# Patient Record
Sex: Male | Born: 1995 | Hispanic: Yes | Marital: Single | Smoking: Current every day smoker
Health system: Southern US, Community
[De-identification: ages and names within clinical notes are randomized; demographics above are authoritative.]

## PROBLEM LIST (undated history)

## (undated) DIAGNOSIS — Z789 Other specified health status: Secondary | ICD-10-CM

---
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
## 2015-03-24 ENCOUNTER — Encounter: Payer: Self-pay | Admitting: Urgent Care

## 2015-03-24 ENCOUNTER — Emergency Department
Admission: EM | Admit: 2015-03-24 | Discharge: 2015-03-25 | Disposition: A | Discharge status: Other Acute Inpatient Hospital | Payer: Self-pay | Attending: Emergency Medicine | Admitting: Emergency Medicine

## 2015-03-24 DIAGNOSIS — Y9389 Activity, other specified: Secondary | ICD-10-CM | POA: Insufficient documentation

## 2015-03-24 DIAGNOSIS — X58XXXA Exposure to other specified factors, initial encounter: Secondary | ICD-10-CM | POA: Insufficient documentation

## 2015-03-24 DIAGNOSIS — T424X2A Poisoning by benzodiazepines, intentional self-harm, initial encounter: Secondary | ICD-10-CM | POA: Insufficient documentation

## 2015-03-24 DIAGNOSIS — Y9289 Other specified places as the place of occurrence of the external cause: Secondary | ICD-10-CM | POA: Insufficient documentation

## 2015-03-24 DIAGNOSIS — Z720 Tobacco use: Secondary | ICD-10-CM | POA: Insufficient documentation

## 2015-03-24 DIAGNOSIS — F322 Major depressive disorder, single episode, severe without psychotic features: Secondary | ICD-10-CM | POA: Insufficient documentation

## 2015-03-24 DIAGNOSIS — T6592XA Toxic effect of unspecified substance, intentional self-harm, initial encounter: Secondary | ICD-10-CM

## 2015-03-24 DIAGNOSIS — Y998 Other external cause status: Secondary | ICD-10-CM | POA: Insufficient documentation

## 2015-03-24 LAB — CBC
HCT: 51.3 % (ref 40.0–52.0)
Hemoglobin: 17.4 g/dL (ref 13.0–18.0)
MCH: 29.7 pg (ref 26.0–34.0)
MCHC: 33.9 g/dL (ref 32.0–36.0)
MCV: 87.7 fL (ref 80.0–100.0)
PLATELETS: 214 10*3/uL (ref 150–440)
RBC: 5.85 MIL/uL (ref 4.40–5.90)
RDW: 12.8 % (ref 11.5–14.5)
WBC: 7.3 10*3/uL (ref 3.8–10.6)

## 2015-03-24 NOTE — ED Notes (Signed)
BEHAVIORAL HEALTH ROUNDING Patient sleeping: No. Patient alert and oriented: yes Behavior appropriate: Yes.  ; If no, describe:  Nutrition and fluids offered: Yes  Toileting and hygiene offered: Yes  Sitter present: no Law enforcement present: Yes, [facility] 

## 2015-03-24 NOTE — ED Notes (Signed)

## 2015-03-24 NOTE — ED Notes (Addendum)
Patient presents reporting that he wants to kill himself. Patient reports that he took 3812 Xanax yesterday and 3 today - "I did it because of what my momma told" - would not elaborate. Patient reporting that he is altered - "I dont even know where the hell I am at right now." After triage completed patient stating, "My momma called me yesterday. I havent talked to her in 4 years. She said I am sorry I even had you and I wish that you were dead."

## 2015-03-25 ENCOUNTER — Inpatient Hospital Stay
Admit: 2015-03-25 | Discharge: 2015-03-29 | DRG: 885 | Disposition: A | Discharge status: Home / Self Care | Payer: No Typology Code available for payment source | Attending: Psychiatry | Admitting: Psychiatry

## 2015-03-25 ENCOUNTER — Encounter: Payer: Self-pay | Admitting: Psychiatry

## 2015-03-25 DIAGNOSIS — Z818 Family history of other mental and behavioral disorders: Secondary | ICD-10-CM | POA: Diagnosis not present

## 2015-03-25 DIAGNOSIS — F410 Panic disorder [episodic paroxysmal anxiety] without agoraphobia: Secondary | ICD-10-CM | POA: Diagnosis present

## 2015-03-25 DIAGNOSIS — F909 Attention-deficit hyperactivity disorder, unspecified type: Secondary | ICD-10-CM | POA: Diagnosis present

## 2015-03-25 DIAGNOSIS — F322 Major depressive disorder, single episode, severe without psychotic features: Secondary | ICD-10-CM | POA: Diagnosis present

## 2015-03-25 DIAGNOSIS — T1491 Suicide attempt: Secondary | ICD-10-CM | POA: Diagnosis present

## 2015-03-25 DIAGNOSIS — F17210 Nicotine dependence, cigarettes, uncomplicated: Secondary | ICD-10-CM | POA: Diagnosis present

## 2015-03-25 DIAGNOSIS — F3130 Bipolar disorder, current episode depressed, mild or moderate severity, unspecified: Secondary | ICD-10-CM | POA: Diagnosis present

## 2015-03-25 DIAGNOSIS — F1310 Sedative, hypnotic or anxiolytic abuse, uncomplicated: Secondary | ICD-10-CM | POA: Diagnosis present

## 2015-03-25 DIAGNOSIS — T424X2A Poisoning by benzodiazepines, intentional self-harm, initial encounter: Secondary | ICD-10-CM | POA: Diagnosis present

## 2015-03-25 DIAGNOSIS — F1320 Sedative, hypnotic or anxiolytic dependence, uncomplicated: Secondary | ICD-10-CM | POA: Diagnosis present

## 2015-03-25 DIAGNOSIS — F319 Bipolar disorder, unspecified: Principal | ICD-10-CM | POA: Diagnosis present

## 2015-03-25 DIAGNOSIS — G4700 Insomnia, unspecified: Secondary | ICD-10-CM | POA: Diagnosis present

## 2015-03-25 DIAGNOSIS — T50902A Poisoning by unspecified drugs, medicaments and biological substances, intentional self-harm, initial encounter: Secondary | ICD-10-CM | POA: Diagnosis present

## 2015-03-25 HISTORY — DX: Other specified health status: Z78.9

## 2015-03-25 LAB — COMPREHENSIVE METABOLIC PANEL
ALT: 19 U/L (ref 17–63)
AST: 24 U/L (ref 15–41)
Albumin: 4.7 g/dL (ref 3.5–5.0)
Alkaline Phosphatase: 100 U/L (ref 38–126)
Anion gap: 9 (ref 5–15)
BUN: 15 mg/dL (ref 6–20)
CO2: 28 mmol/L (ref 22–32)
Calcium: 9.5 mg/dL (ref 8.9–10.3)
Chloride: 103 mmol/L (ref 101–111)
Creatinine, Ser: 1.05 mg/dL (ref 0.61–1.24)
Glucose, Bld: 84 mg/dL (ref 65–99)
POTASSIUM: 3.9 mmol/L (ref 3.5–5.1)
SODIUM: 140 mmol/L (ref 135–145)
Total Bilirubin: 0.6 mg/dL (ref 0.3–1.2)
Total Protein: 7.8 g/dL (ref 6.5–8.1)

## 2015-03-25 LAB — URINE DRUG SCREEN, QUALITATIVE (ARMC ONLY)
Amphetamines, Ur Screen: NOT DETECTED
Barbiturates, Ur Screen: NOT DETECTED
Benzodiazepine, Ur Scrn: POSITIVE — AB
CANNABINOID 50 NG, UR ~~LOC~~: POSITIVE — AB
Cocaine Metabolite,Ur ~~LOC~~: NOT DETECTED
MDMA (Ecstasy)Ur Screen: NOT DETECTED
Methadone Scn, Ur: NOT DETECTED
Opiate, Ur Screen: NOT DETECTED
PHENCYCLIDINE (PCP) UR S: NOT DETECTED
Tricyclic, Ur Screen: NOT DETECTED

## 2015-03-25 LAB — ACETAMINOPHEN LEVEL

## 2015-03-25 LAB — SALICYLATE LEVEL

## 2015-03-25 LAB — ETHANOL: Alcohol, Ethyl (B): <5 mg/dL (ref <5)

## 2015-03-25 MED ORDER — ALUM & MAG HYDROXIDE-SIMETH 200-200-20 MG/5ML PO SUSP: 30.0000 mL | ORAL | Status: DC | PRN

## 2015-03-25 MED ORDER — CHLORDIAZEPOXIDE HCL 25 MG PO CAPS
25.0000 mg | ORAL_CAPSULE | Freq: Four times a day (QID) | ORAL | Status: DC
  Administered 2015-03-25 – 2015-03-26 (×5): 25 mg via ORAL
  Filled 2015-03-25 (×6): qty 1

## 2015-03-25 MED ORDER — NICOTINE 10 MG IN INHA
1.0000 | RESPIRATORY_TRACT | Status: DC | PRN
  Administered 2015-03-25: 1 via RESPIRATORY_TRACT

## 2015-03-25 MED ORDER — CARBAMAZEPINE 200 MG PO TABS
200.0000 mg | ORAL_TABLET | Freq: Two times a day (BID) | ORAL | Status: DC
  Administered 2015-03-25 – 2015-03-29 (×8): 200 mg via ORAL
  Filled 2015-03-25 (×8): qty 1

## 2015-03-25 MED ORDER — ACETAMINOPHEN 325 MG PO TABS: 650.0000 mg | ORAL_TABLET | Freq: Four times a day (QID) | ORAL | Status: DC | PRN

## 2015-03-25 MED ORDER — SERTRALINE HCL 50 MG PO TABS
50.0000 mg | ORAL_TABLET | Freq: Every day | ORAL | Status: DC
  Administered 2015-03-25 – 2015-03-27 (×3): 50 mg via ORAL
  Filled 2015-03-25 (×3): qty 1

## 2015-03-25 MED ORDER — MAGNESIUM HYDROXIDE 400 MG/5ML PO SUSP: 30.0000 mL | Freq: Every day | ORAL | Status: DC | PRN

## 2015-03-25 MED ORDER — NICOTINE 10 MG IN INHA
RESPIRATORY_TRACT | Status: AC
  Administered 2015-03-25: 1 via RESPIRATORY_TRACT
  Filled 2015-03-25: qty 36

## 2015-03-25 MED ORDER — TRAZODONE HCL 100 MG PO TABS
100.0000 mg | ORAL_TABLET | Freq: Every day | ORAL | Status: DC
  Administered 2015-03-25 – 2015-03-26 (×2): 100 mg via ORAL
  Filled 2015-03-25 (×2): qty 1

## 2015-03-25 NOTE — ED Notes (Signed)
BEHAVIORAL HEALTH ROUNDING Patient sleeping: Yes.   Patient alert and oriented: not applicable Behavior appropriate: Yes.  ; If no, describe:  Nutrition and fluids offered: Yes  Toileting and hygiene offered: Yes  Sitter present: yes Law enforcement present: Yes [facility] 

## 2015-03-25 NOTE — ED Notes (Signed)

## 2015-03-25 NOTE — H&P (Signed)
Psychiatric Admission Assessment Adult  Patient Identification: Ryan Mccormick MRN:  [id number] Date of Evaluation:  03/25/2015 Chief Complaint:  Major Depression Principal Diagnosis: <principal problem not specified> Diagnosis:   Patient Active Problem List   Diagnosis Date Noted  . 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 Mr. Ryan Mccormick is a 19 year old male with history of depression, anxiety, mood instability, and substance use.  Chief complaint. "My mother called me."  History of present illness. Mr. Ryan Mccormick was diagnosed with ADHD in his childhood but never treated. In his teens he developed severe anxiety with frequent panic attacks. He has never seen a psychiatrist and has never had any prescribed medications but started using Xanax is off the street. He believes that in order to function at all he needs to take Xanax all the time. In addition he's been using other drugs and on Sunday went on a drug binge with Xanax, Klonopin, acid, Molly's, and alcohol. On Tuesday he received a phone call from his mother with whom he has not talked for 6 or so months. She told him that she didn't like him and she wished he were never born. He got upset to goal the Xanax as he had on him at 12 or 13 of unknown strength and was drinking. His friends and he is and his cousin were present and they brought him to the emergency room. He does not remember much from that point on. He woke up in the emergency room. He denies feeling suicidal now and does not believe that this was a suicide attempt and that he acted on an impulse. He reports many symptoms of depression with extremely poor sleep, decreased appetite reportedly has not eaten in 3 weeks, social isolation, crying spells, anhedonia, feeling of guilt and hopelessness worthlessness, extreme mood swings, and heightened anxiety, and passing suicidal  thoughts. He overdosed several times in the past but has never come to the hospital. He reports periods of severe insomnia hyperactivity out of character behavior with break psychotic symptoms hearing voices that are not there and seeing shadows. He does not drink alcohol. He admits that he is using Xanax on a regular basis. Infrequently when partly uses other substance including cocaine.  Past psychiatric history. He was diagnosed with ADHD and while in school but never treated. No hospital admissions no confirmed suicide attempts, no substance abuse treatments.   family psychiatric history. Multiple family members on his father's side with depression and bipolar including grandmother and grandfather. On his mother's side there is a grandmother with bipolar disorder. Apparently his mother is not well either.  Social history. He dropped out of high school just now. This was mostly because he was using Xanax and did not want to get in trouble. He will go to community college for his GED's. He did not do well in school. He currently lives with his aunt. He works as a Psychologist, sport and exercise at [employer]. He has a girlfriend and a group of friends   Total Time spent with patient: 1 hour  Past Medical History: No past medical history on file. No past surgical history on file. Family History: No family history on file. Social History:  History  Alcohol Use No     History  Drug Use  . Yes  . Special: Marijuana    History   Social History  . Marital Status: Single    Spouse Name: N/A  .  Number of Children: N/A  . Years of Education: N/A   Social History Main Topics  . Smoking status: Current Every Day Smoker    Types: Cigarettes  . Smokeless tobacco: Not on file  . Alcohol Use: No  . Drug Use: Yes    Special: Marijuana  . Sexual Activity: Yes   Other Topics Concern  . Not on file   Social History Narrative   Additional Social History:                           Musculoskeletal: Strength & Muscle Tone: within normal limits Gait & Station: normal Patient leans: N/A  Psychiatric Specialty Exam: Physical Exam  Nursing note and vitals reviewed.   Review of Systems  All other systems reviewed and are negative.   There were no vitals taken for this visit.There is no weight on file to calculate BMI.  See SRA.                                                  Sleep:      Risk to Self:   Risk to Others:   Prior Inpatient Therapy:   Prior Outpatient Therapy:    Alcohol Screening:    Allergies:  No Known Allergies Lab Results:  Results for orders placed or performed during the hospital encounter of 03/24/15 (from the past 48 hour(s))  CBC     Status: None   Collection Time: 03/24/15 11:31 PM  Result Value Ref Range   WBC 7.3 3.8 - 10.6 K/uL   RBC 5.85 4.40 - 5.90 MIL/uL   Hemoglobin 17.4 13.0 - 18.0 g/dL   HCT 51.3 40.0 - 52.0 %   MCV 87.7 80.0 - 100.0 fL   MCH 29.7 26.0 - 34.0 pg   MCHC 33.9 32.0 - 36.0 g/dL   RDW 12.8 11.5 - 14.5 %   Platelets 214 150 - 440 K/uL  Comprehensive metabolic panel     Status: None   Collection Time: 03/24/15 11:31 PM  Result Value Ref Range   Sodium 140 135 - 145 mmol/L   Potassium 3.9 3.5 - 5.1 mmol/L   Chloride 103 101 - 111 mmol/L   CO2 28 22 - 32 mmol/L   Glucose, Bld 84 65 - 99 mg/dL   BUN 15 6 - 20 mg/dL   Creatinine, Ser 1.05 0.61 - 1.24 mg/dL   Calcium 9.5 8.9 - 10.3 mg/dL   Total Protein 7.8 6.5 - 8.1 g/dL   Albumin 4.7 3.5 - 5.0 g/dL   AST 24 15 - 41 U/L   ALT 19 17 - 63 U/L   Alkaline Phosphatase 100 38 - 126 U/L   Total Bilirubin 0.6 0.3 - 1.2 mg/dL   GFR calc non Af Amer >60 >60 mL/min   GFR calc Af Amer >60 >60 mL/min    Comment: (NOTE) The eGFR has been calculated using the CKD EPI equation. This calculation has not been validated in all clinical situations. eGFR's persistently <60 mL/min signify possible Chronic Kidney Disease.    Anion gap 9 5 -  15  Ethanol (ETOH)     Status: None   Collection Time: 03/24/15 11:31 PM  Result Value Ref Range   Alcohol, Ethyl (B) <5 <5 mg/dL    Comment:  LOWEST DETECTABLE LIMIT FOR SERUM ALCOHOL IS 5 mg/dL FOR MEDICAL PURPOSES ONLY   Acetaminophen level     Status: Abnormal   Collection Time: 03/24/15 11:31 PM  Result Value Ref Range   Acetaminophen (Tylenol), Serum <10 (L) 10 - 30 ug/mL    Comment:        THERAPEUTIC CONCENTRATIONS VARY SIGNIFICANTLY. A RANGE OF 10-30 ug/mL MAY BE AN EFFECTIVE CONCENTRATION FOR MANY PATIENTS. HOWEVER, SOME ARE BEST TREATED AT CONCENTRATIONS OUTSIDE THIS RANGE. ACETAMINOPHEN CONCENTRATIONS >150 ug/mL AT 4 HOURS AFTER INGESTION AND >50 ug/mL AT 12 HOURS AFTER INGESTION ARE OFTEN ASSOCIATED WITH TOXIC REACTIONS.   Salicylate level     Status: None   Collection Time: 03/24/15 11:31 PM  Result Value Ref Range   Salicylate Lvl <2.5 2.8 - 30.0 mg/dL  Urine Drug Screen, Qualitative ([facility] only)     Status: Abnormal   Collection Time: 03/24/15 11:32 PM  Result Value Ref Range   Tricyclic, Ur Screen NONE DETECTED NONE DETECTED   Amphetamines, Ur Screen NONE DETECTED NONE DETECTED   MDMA (Ecstasy)Ur Screen NONE DETECTED NONE DETECTED   Cocaine Metabolite,Ur [place] NONE DETECTED NONE DETECTED   Opiate, Ur Screen NONE DETECTED NONE DETECTED   Phencyclidine (PCP) Ur S NONE DETECTED NONE DETECTED   Cannabinoid 50 Ng, Ur  POSITIVE (A) NONE DETECTED   Barbiturates, Ur Screen NONE DETECTED NONE DETECTED   Benzodiazepine, Ur Scrn POSITIVE (A) NONE DETECTED   Methadone Scn, Ur NONE DETECTED NONE DETECTED    Comment: (NOTE) 427  Tricyclics, urine               Cutoff 1000 ng/mL 200  Amphetamines, urine             Cutoff 1000 ng/mL 300  MDMA (Ecstasy), urine           Cutoff 500 ng/mL 400  Cocaine Metabolite, urine       Cutoff 300 ng/mL 500  Opiate, urine                   Cutoff 300 ng/mL 600  Phencyclidine (PCP), urine      Cutoff 25 ng/mL 700   Cannabinoid, urine              Cutoff 50 ng/mL 800  Barbiturates, urine             Cutoff 200 ng/mL 900  Benzodiazepine, urine           Cutoff 200 ng/mL 1000 Methadone, urine                Cutoff 300 ng/mL 1100 1200 The urine drug screen provides only a preliminary, unconfirmed 1300 analytical test result and should not be used for non-medical 1400 purposes. Clinical consideration and professional judgment should 1500 be applied to any positive drug screen result due to possible 1600 interfering substances. A more specific alternate chemical method 1700 must be used in order to obtain a confirmed analytical result.  1800 Gas chromato graphy / mass spectrometry (GC/MS) is the preferred 1900 confirmatory method.    Current Medications: Current Facility-Administered Medications  Medication Dose Route Frequency Provider Last Rate Last Dose  . acetaminophen (TYLENOL) tablet 650 mg  650 mg Oral Q6H PRN Jolanta B Pucilowska, MD      . alum & mag hydroxide-simeth (MAALOX/MYLANTA) 200-200-20 MG/5ML suspension 30 mL  30 mL Oral Q4H PRN Jolanta B Pucilowska, MD      . carbamazepine (TEGRETOL) tablet 200 mg  200 mg Oral BID Jolanta B Pucilowska, MD      . chlordiazePOXIDE (LIBRIUM) capsule 25 mg  25 mg Oral QID Jolanta B Pucilowska, MD      . magnesium hydroxide (MILK OF MAGNESIA) suspension 30 mL  30 mL Oral Daily PRN Jolanta B Pucilowska, MD      . sertraline (ZOLOFT) tablet 50 mg  50 mg Oral Daily Jolanta B Pucilowska, MD      . traZODone (DESYREL) tablet 100 mg  100 mg Oral QHS Jolanta B Pucilowska, MD       PTA Medications: No prescriptions prior to admission    Previous Psychotropic Medications: No   Substance Abuse History in the last 12 months:  Yes.      Consequences of Substance Abuse: Negative  Results for orders placed or performed during the hospital encounter of 03/24/15 (from the past 72 hour(s))  CBC     Status: None   Collection Time: 03/24/15 11:31 PM  Result  Value Ref Range   WBC 7.3 3.8 - 10.6 K/uL   RBC 5.85 4.40 - 5.90 MIL/uL   Hemoglobin 17.4 13.0 - 18.0 g/dL   HCT 51.3 40.0 - 52.0 %   MCV 87.7 80.0 - 100.0 fL   MCH 29.7 26.0 - 34.0 pg   MCHC 33.9 32.0 - 36.0 g/dL   RDW 12.8 11.5 - 14.5 %   Platelets 214 150 - 440 K/uL  Comprehensive metabolic panel     Status: None   Collection Time: 03/24/15 11:31 PM  Result Value Ref Range   Sodium 140 135 - 145 mmol/L   Potassium 3.9 3.5 - 5.1 mmol/L   Chloride 103 101 - 111 mmol/L   CO2 28 22 - 32 mmol/L   Glucose, Bld 84 65 - 99 mg/dL   BUN 15 6 - 20 mg/dL   Creatinine, Ser 1.05 0.61 - 1.24 mg/dL   Calcium 9.5 8.9 - 10.3 mg/dL   Total Protein 7.8 6.5 - 8.1 g/dL   Albumin 4.7 3.5 - 5.0 g/dL   AST 24 15 - 41 U/L   ALT 19 17 - 63 U/L   Alkaline Phosphatase 100 38 - 126 U/L   Total Bilirubin 0.6 0.3 - 1.2 mg/dL   GFR calc non Af Amer >60 >60 mL/min   GFR calc Af Amer >60 >60 mL/min    Comment: (NOTE) The eGFR has been calculated using the CKD EPI equation. This calculation has not been validated in all clinical situations. eGFR's persistently <60 mL/min signify possible Chronic Kidney Disease.    Anion gap 9 5 - 15  Ethanol (ETOH)     Status: None   Collection Time: 03/24/15 11:31 PM  Result Value Ref Range   Alcohol, Ethyl (B) <5 <5 mg/dL    Comment:        LOWEST DETECTABLE LIMIT FOR SERUM ALCOHOL IS 5 mg/dL FOR MEDICAL PURPOSES ONLY   Acetaminophen level     Status: Abnormal   Collection Time: 03/24/15 11:31 PM  Result Value Ref Range   Acetaminophen (Tylenol), Serum <10 (L) 10 - 30 ug/mL    Comment:        THERAPEUTIC CONCENTRATIONS VARY SIGNIFICANTLY. A RANGE OF 10-30 ug/mL MAY BE AN EFFECTIVE CONCENTRATION FOR MANY PATIENTS. HOWEVER, SOME ARE BEST TREATED AT CONCENTRATIONS OUTSIDE THIS RANGE. ACETAMINOPHEN CONCENTRATIONS >150 ug/mL AT 4 HOURS AFTER INGESTION AND >50 ug/mL AT 12 HOURS AFTER INGESTION ARE OFTEN ASSOCIATED WITH TOXIC REACTIONS.   Salicylate level  Status: None   Collection Time: 03/24/15 11:31 PM  Result Value Ref Range   Salicylate Lvl <6.2 2.8 - 30.0 mg/dL  Urine Drug Screen, Qualitative ([facility] only)     Status: Abnormal   Collection Time: 03/24/15 11:32 PM  Result Value Ref Range   Tricyclic, Ur Screen NONE DETECTED NONE DETECTED   Amphetamines, Ur Screen NONE DETECTED NONE DETECTED   MDMA (Ecstasy)Ur Screen NONE DETECTED NONE DETECTED   Cocaine Metabolite,Ur [place] NONE DETECTED NONE DETECTED   Opiate, Ur Screen NONE DETECTED NONE DETECTED   Phencyclidine (PCP) Ur S NONE DETECTED NONE DETECTED   Cannabinoid 50 Ng, Ur  POSITIVE (A) NONE DETECTED   Barbiturates, Ur Screen NONE DETECTED NONE DETECTED   Benzodiazepine, Ur Scrn POSITIVE (A) NONE DETECTED   Methadone Scn, Ur NONE DETECTED NONE DETECTED    Comment: (NOTE) 947  Tricyclics, urine               Cutoff 1000 ng/mL 200  Amphetamines, urine             Cutoff 1000 ng/mL 300  MDMA (Ecstasy), urine           Cutoff 500 ng/mL 400  Cocaine Metabolite, urine       Cutoff 300 ng/mL 500  Opiate, urine                   Cutoff 300 ng/mL 600  Phencyclidine (PCP), urine      Cutoff 25 ng/mL 700  Cannabinoid, urine              Cutoff 50 ng/mL 800  Barbiturates, urine             Cutoff 200 ng/mL 900  Benzodiazepine, urine           Cutoff 200 ng/mL 1000 Methadone, urine                Cutoff 300 ng/mL 1100 1200 The urine drug screen provides only a preliminary, unconfirmed 1300 analytical test result and should not be used for non-medical 1400 purposes. Clinical consideration and professional judgment should 1500 be applied to any positive drug screen result due to possible 1600 interfering substances. A more specific alternate chemical method 1700 must be used in order to obtain a confirmed analytical result.  1800 Gas chromato graphy / mass spectrometry (GC/MS) is the preferred 1900 confirmatory method.     Observation Level/Precautions:  15 minute checks  Laboratory:   CBC Chemistry Profile UDS UA  Psychotherapy:    Medications:    Consultations:    Discharge Concerns:    Estimated LOS:  Other:     Psychological Evaluations: No   Treatment Plan Summary: Daily contact with patient to assess and evaluate symptoms and progress in treatment and Medication management  Medical Decision Making:  New problem, with additional work up planned, Review of Psycho-Social Stressors (1), Review or order clinical lab tests (1), Review of Medication Regimen & Side Effects (2) and Review of New Medication or Change in Dosage (2)   Mr. Ryan Mccormick is a 19 year old male with history of depression, anxiety, mood instability, and substance use admitted after a suicide attempt by Xanax overdose.  1. Suicidal ideation. The patient is able to contract for safety.  2. Mood. We will start Tegretol 200 mg twice daily for mood stabilization and Zoloft 50 mg for depression.  3. Anxiety. The patient has been taking Xanax off the street to treat his anxiety. He takes them daily.  Will start Librium taper.  4. Insomnia. We'll start trazodone.  5. Smoking. Nicotine products are available.  6. Disposition. He will be discharged to home with his family. Follow-up with [facility].   I certify that inpatient services furnished can reasonably be expected to improve the patient's condition.   Jolanta Pucilowska 7/7/20162:17 PM

## 2015-03-25 NOTE — ED Notes (Signed)
Pt report given to Joanne CharsBeth B RN. Pt transferred to ED BHU accompanied by reporting RN without incident.

## 2015-03-25 NOTE — ED Notes (Signed)
BEHAVIORAL HEALTH ROUNDING Patient sleeping: Yes.   Patient alert and oriented: not applicable Behavior appropriate: Yes.    Nutrition and fluids offered: No Toileting and hygiene offered: No Sitter present: q15 minute observations and security camera monitoring Law enforcement present: Yes [employer] 

## 2015-03-25 NOTE — ED Notes (Signed)
BEHAVIORAL HEALTH ROUNDING Patient sleeping: Yes.   Patient alert and oriented: yes Behavior appropriate: Yes.  ; If no, describe:  Nutrition and fluids offered: Yes  Toileting and hygiene offered: Yes  Sitter present: yes Law enforcement present: Yes [facility]  

## 2015-03-25 NOTE — [facility] Group Notes (Signed)
[facility] Group Notes:  (Nursing/MHT/Case Management/Adjunct)  Date:  03/25/2015  Time:  9:22 PM  Type of Therapy:  Group Therapy  Participation Level:  Active  Participation Quality:  Appropriate  Affect:  Appropriate  Cognitive:  Appropriate  Insight:  Good  Engagement in Group:  Engaged  Modes of Intervention:  n/a  Summary of Progress/Problems:  Veva Holesshley Imani Natavia Sublette 03/25/2015, 9:22 PM

## 2015-03-25 NOTE — [facility] Suicide Risk Assessment (Addendum)
[facility] Admission Suicide Risk Assessment   Nursing information obtained from:    Demographic factors:    Current Mental Status:    Loss Factors:    Historical Factors:    Risk Reduction Factors:    Total Time spent with patient: 1 hour Principal Problem: <principal problem not specified> Diagnosis:   Patient Active Problem List   Diagnosis Date Noted  . 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  World Science writer [facility]). Score between 0-7:  no or low risk or alcohol related problems. Score between 8-15:  moderate risk of alcohol related problems. Score between 16-19:  high risk of alcohol related problems. Score 20 or above:  warrants further diagnostic evaluation for alcohol dependence and treatment.   CLINICAL FACTORS:   Severe Anxiety and/or Agitation Bipolar Disorder:   Depressive phase Alcohol/Substance Abuse/Dependencies   Musculoskeletal: Strength & Muscle Tone: within normal limits Gait & Station: normal Patient leans: N/A  Psychiatric Specialty Exam:   I reviewed physical exam performed in the emergency room and calm With its findings. Physical Exam  Nursing note and vitals reviewed.   Review of Systems  All other systems reviewed and are negative.   There were no vitals taken for this visit.There is no weight on file to calculate BMI.  General Appearance: Casual  Eye Contact::  Good  Speech:  Clear and Coherent  Volume:  Normal  Mood:  Anxious  Affect:  Appropriate  Thought Process:  Goal Directed  Orientation:  Full (Time, Place, and Person)  Thought Content:  WDL  Suicidal Thoughts:  Yes.  without intent/plan  Homicidal Thoughts:  No  Memory:  Immediate;   Fair Recent;   Fair Remote;   Fair  Judgement:  Impaired  Insight:  Shallow  Psychomotor  Activity:  Normal  Concentration:  Fair  Recall:  [employer] of Knowledge:Fair  Language: Fair  Akathisia:  No  Handed:  Right  AIMS (if indicated):     Assets:  Architect Housing Physical Health  Sleep:     Cognition: WNL  ADL's:  Intact     COGNITIVE FEATURES THAT CONTRIBUTE TO RISK:  None    SUICIDE RISK:   Moderate:  Frequent suicidal ideation with limited intensity, and duration, some specificity in terms of plans, no associated intent, good self-control, limited dysphoria/symptomatology, some risk factors present, and identifiable protective factors, including available and accessible social support.  PLAN OF CARE: Hospital admission, medication management, substance abuse counseling, discharge planning.  Medical Decision Making:  New problem, with additional work up planned, Review of Psycho-Social Stressors (1), Review or order clinical lab tests (1), Review of Medication Regimen & Side Effects (2) and Review of New Medication or Change in Dosage (2)   Ryan Mccormick is a 19 year old male with history of depression, anxiety, mood instability, and substance use admitted after a suicide attempt by Xanax overdose.  1. Suicidal ideation. The patient is able to contract for safety.  2. Mood. We will start Tegretol 200 mg twice daily for mood stabilization and Zoloft 50 mg for depression.  3. Anxiety. The patient has been taking Xanax off the street to treat his anxiety. He takes them daily. Will start Librium taper.  4. Insomnia. We'll start trazodone.  5. Smoking. Nicotine products are available.  6. Disposition. He will be  discharged to home with his family. Follow-up with [facility].  I certify that inpatient services furnished can reasonably be expected to improve the patient's condition.   Ryan Mccormick 03/25/2015, 2:12 PM 2

## 2015-03-25 NOTE — Tx Team (Signed)
Initial Interdisciplinary Treatment Plan   PATIENT STRESSORS: Educational concerns Marital or family conflict Medication change or noncompliance   PATIENT STRENGTHS: Capable of independent living Manufacturing systems engineerCommunication skills Motivation for treatment/growth   PROBLEM LIST: Problem List/Patient Goals Date to be addressed Date deferred Reason deferred Estimated date of resolution  Major Depression  03/25/15     Anxiety  03/25/15                                                 DISCHARGE CRITERIA:  Adequate post-discharge living arrangements Improved stabilization in mood, thinking, and/or behavior Motivation to continue treatment in a less acute level of care  PRELIMINARY DISCHARGE PLAN: Attend aftercare/continuing care group Return to previous living arrangement Return to previous work or school arrangements  PATIENT/FAMIILY INVOLVEMENT: This treatment plan has been presented to and reviewed with the patient, Ryan Mccormick, and/or family member, .  The patient and family have been given the opportunity to ask questions and make suggestions.  Ignacia FellingJennifer A Jacy Brocker 03/25/2015, 3:56 PM

## 2015-03-25 NOTE — BH Assessment (Signed)
Assessment Note  Ryan Mccormick is an 19 y.o. male, who presents to the ED via a family member(cousin) for c/o having taken an overdose of xanax. Client stating, "I got a call from my mom telling me, "I wish I never had you; I wish you were dead." "I went overboard and took some pills; I took (13) xanax pills and drank (3) shots of vodka; and I blacked out; I couldn't breathe; I had some weed and cocaine today; you see, me and my mom don't get along; once I turned (13); she stopped caring for me; I thought about not being bothered any more; and I took the pills." BAC: <5; UDS: pending.  Axis I: Generalized Anxiety Disorder, Major Depression, Recurrent severe and Substance Abuse Axis II: Deferred Axis III: History reviewed. No pertinent past medical history. Axis IV: other psychosocial or environmental problems, problems with access to health care services and problems with primary support group Axis V: 1-10 persistent dangerousness to self and others present  Past Medical History: History reviewed. No pertinent past medical history.  History reviewed. No pertinent past surgical history.  Family History: No family history on file.  Social History:  reports that he has been smoking Cigarettes.  He does not have any smokeless tobacco history on file. He reports that he uses illicit drugs (Marijuana). He reports that he does not drink alcohol.  Additional Social History:     CIWA: CIWA-Ar BP: 136/77 mmHg Pulse Rate: 71 COWS:    Allergies: No Known Allergies  Home Medications:  (Not in a hospital admission)  OB/GYN Status:  No LMP for male patient.  General Assessment Data [facility] Assessment: In system Is this a Tele or Face-to-Face Assessment?: Face-to-Face Is this an Initial Assessment or a Re-assessment for this encounter?: Re-Assessment Marital status: Single Maiden name: none Is patient pregnant?: No Pregnancy Status: No Living Arrangements:  Parent ("I live with my stepdad.") Can pt return to current living arrangement?: Yes Admission Status: Voluntary Is patient capable of signing voluntary admission?: Yes Referral Source: Self/Family/Friend Insurance type: none  Medical Screening Exam [facility] Walk-in ONLY) Medical Exam completed: No  Crisis Care Plan Living Arrangements: Parent ("I live with my stepdad.") Name of Psychiatrist: none Name of Therapist: none  Education Status Is patient currently in school?: No Current Grade: none Highest grade of school patient has completed: 11th Name of school: n/a Contact person: stepdad  Risk to self with the past 6 months Suicidal Ideation: Yes-Currently Present Has patient been a risk to self within the past 6 months prior to admission? : Yes Suicidal Intent: Yes-Currently Present Has patient had any suicidal intent within the past 6 months prior to admission? : Yes Is patient at risk for suicide?: Yes Suicidal Plan?: Yes-Currently Present Has patient had any suicidal plan within the past 6 months prior to admission? : No Specify Current Suicidal Plan: "I took a bunch of xanax." Access to Means: Yes Specify Access to Suicidal Means: pills What has been your use of drugs/alcohol within the last 12 months?: alcohol; cocaine Previous Attempts/Gestures: Yes How many times?: 1 Other Self Harm Risks: feeling helpless and hopeless Triggers for Past Attempts: Family contact Intentional Self Injurious Behavior: None Family Suicide History: No Recent stressful life event(s): Conflict (Comment) ("My mother called me and told me, "I wish I never had you.") Persecutory voices/beliefs?: No Depression: Yes Depression Symptoms: Tearfulness, Despondent, Feeling worthless/self pity Substance abuse history and/or treatment for substance abuse?: Yes Suicide prevention  information given to non-admitted patients: Yes  Risk to Others within the past 6 months Homicidal Ideation: No Does  patient have any lifetime risk of violence toward others beyond the six months prior to admission? : No Thoughts of Harm to Others: No Current Homicidal Intent: No Current Homicidal Plan: No Access to Homicidal Means: No Identified Victim: none History of harm to others?: No Assessment of Violence: On admission Violent Behavior Description: none Does patient have access to weapons?: No Criminal Charges Pending?: No Does patient have a court date: No Is patient on probation?: No  Psychosis Hallucinations: None noted Delusions: None noted  Mental Status Report Appearance/Hygiene: In scrubs Eye Contact: Fair Motor Activity: Unremarkable Speech: Slow, Slurred Level of Consciousness: Drowsy Mood: Helpless, Sad Affect: Depressed, Sad Anxiety Level: Minimal Thought Processes: Circumstantial Judgement: Impaired Orientation: Person, Place, Situation Obsessive Compulsive Thoughts/Behaviors: Minimal  Cognitive Functioning Concentration: Decreased Memory: Recent Impaired, Remote Intact IQ: Average Insight: Poor Impulse Control: Poor Appetite: Fair Weight Loss: 0 Weight Gain: 0 Sleep: Decreased Total Hours of Sleep: 4 Vegetative Symptoms: None  ADLScreening [facility] Assessment Services) Patient's cognitive ability adequate to safely complete daily activities?: Yes Patient able to express need for assistance with ADLs?: Yes Independently performs ADLs?: Yes (appropriate for developmental age)  Prior Inpatient Therapy Prior Inpatient Therapy: No Prior Therapy Dates: denies  Prior Outpatient Therapy Prior Outpatient Therapy: No Does patient have an ACCT team?: No Does patient have Intensive In-House Services?  : No Does patient have [facility] services? : No Does patient have P4CC services?: No  ADL Screening (condition at time of admission) Patient's cognitive ability adequate to safely complete daily activities?: Yes Patient able to express need for assistance with ADLs?:  Yes Independently performs ADLs?: Yes (appropriate for developmental age)       Abuse/Neglect Assessment (Assessment to be complete while patient is alone) Physical Abuse: Denies Verbal Abuse: Denies Sexual Abuse: Denies Exploitation of patient/patient's resources: Denies Self-Neglect: Denies Values / Beliefs Cultural Requests During Hospitalization: None Spiritual Requests During Hospitalization: None Consults Spiritual Care Consult Needed: No Social Work Consult Needed: No      Additional Information 1:1 In Past 12 Months?: No CIRT Risk: No Elopement Risk: No Does patient have medical clearance?: No (continuing to monitor)  Child/Adolescent Assessment Running Away Risk: Denies Bed-Wetting: Denies Destruction of Property: Denies Cruelty to Animals: Denies Stealing: Denies Rebellious/Defies Authority: Denies Satanic Involvement: Denies Archivist: Denies Problems at [employer]: Denies Gang Involvement: Denies  Disposition:  Disposition Initial Assessment Completed for this Encounter: Yes Disposition of Patient: Referred to (psych MD to see) Patient referred to: Other (Comment)  On Site Evaluation by:   Reviewed with Physician:    Dwan Bolt 03/25/2015 12:18 AM

## 2015-03-25 NOTE — Plan of Care (Signed)
Problem: Ineffective individual coping Goal: LTG: Patient will report a decrease in negative feelings Outcome: Progressing Patient denies SI/HI at this time. Encouraged to attend therapy groups and learn coping skills to manage stressors.

## 2015-03-25 NOTE — ED Provider Notes (Signed)
Patient remains medically stable with normal vital signs. He is admitted to the behavioral medicine unit for further psychiatric evaluation and stabilization.  [name], MD 03/25/15 1106

## 2015-03-25 NOTE — ED Provider Notes (Signed)
[facility] Emergency Department Provider Note ____________________________________________  Time seen: Approximately 12:03 AM  I have reviewed the triage vital signs and the nursing notes.   HISTORY  Chief Complaint Suicide Attempt  HPI Ryan Mccormick is a 19 y.o. male who states that he had an argument with his mother today and she told him that "she wishes he would have never been born and she hated him "therefore patient decided that there was no reason for him to live on earth anymore and he took 69-15 Xanax and Klonopin along with smoking some marijuana doing some cocaine. This all occurred about 5 or 6 in the evening and patient states that he initially was very dizzy and sleepy but now he feels much better. Patient's cousin brought him here to be evaluated. Patient denies any other significant medical issues.  History reviewed. No pertinent past medical history.  There are no active problems to display for this patient.   History reviewed. No pertinent past surgical history.  No current outpatient prescriptions on file.  Allergies Review of patient's allergies indicates no known allergies.  No family history on file.  Social History History  Substance Use Topics  . Smoking status: Current Every Day Smoker    Types: Cigarettes  . Smokeless tobacco: Not on file  . Alcohol Use: No    Review of Systems Constitutional: No fever/chills Eyes: No visual changes. ENT: No sore throat. Cardiovascular: Denies chest pain. Respiratory: Denies shortness of breath. Gastrointestinal: No abdominal pain.  No nausea, no vomiting.  No diarrhea.  No constipation. Genitourinary: Negative for dysuria. Musculoskeletal: Negative for back pain. Skin: Negative for rash. Neurological: Negative for headaches, focal weakness or numbness. Psychiatric:Severe depression, suicide attempt by overdose 10-point ROS otherwise  negative.  ____________________________________________   PHYSICAL EXAM:  VITAL SIGNS: ED Triage Vitals  Enc Vitals Group     BP 03/24/15 2321 136/77 mmHg     Pulse Rate 03/24/15 2321 71     Resp 03/24/15 2321 14     Temp 03/24/15 2321 98.9 F (37.2 C)     Temp Source 03/24/15 2321 Oral     SpO2 03/24/15 2321 98 %     Weight 03/24/15 2321 220 lb (99.791 kg)     Height 03/24/15 2321  (1.727 m)     Head Cir --      Peak Flow --      Pain Score 03/24/15 2322 8     Pain Loc --      Pain Edu? --      Excl. in GC? --     Constitutional: Alert and oriented. Well appearing and in no acute distress. Eyes: Conjunctivae are normal. PERRL. EOMI. Head: Atraumatic. Nose: No congestion/rhinnorhea. Mouth/Throat: Mucous membranes are moist.  Oropharynx non-erythematous. Neck: No stridor.   Cardiovascular: Normal rate, regular rhythm. Grossly normal heart sounds.  Good peripheral circulation. Respiratory: Normal respiratory effort.  No retractions. Lungs CTAB. Gastrointestinal: Soft and nontender. No distention. No abdominal bruits. No CVA tenderness. Musculoskeletal: No lower extremity tenderness nor edema.  No joint effusions. Neurologic:  Normal speech and language. No gross focal neurologic deficits are appreciated. Speech is normal. No gait instability. Skin:  Skin is warm, dry and intact. No rash noted. Psychiatric: Mood and affect are very depressed. Speech and behavior are normal.  ____________________________________________   LABS (all labs ordered are listed, but only abnormal results are displayed)  Labs Reviewed  ACETAMINOPHEN LEVEL - Abnormal; Notable for the following:  Acetaminophen (Tylenol), Serum <10 (*)    All other components within normal limits  CBC  COMPREHENSIVE METABOLIC PANEL  ETHANOL  SALICYLATE LEVEL  URINE DRUG SCREEN, QUALITATIVE ([facility] ONLY)    ____________________________________________  EKG  None ____________________________________________  RADIOLOGY  None ____________________________________________   PROCEDURES  Procedure(s) performed: None  Critical Care performed: No  ____________________________________________   INITIAL IMPRESSION / ASSESSMENT AND PLAN / ED COURSE  Pertinent labs & imaging results that were available during my care of the patient were reviewed by me and considered in my medical decision making (see chart for details).  ----------------------------------------- 12:29 AM on 03/25/2015 -----------------------------------------  All patient's labs are normal and it has been at least 6 hours since patient took the overdose. Patient is alert and oriented 3 with no focal deficits at this time. Patient is medically cleared to go to the behavior health unit. ____________________________________________   FINAL CLINICAL IMPRESSION(S) / ED DIAGNOSES  Final diagnoses:  Severe depression  Suicide attempt by substance overdose, initial encounter      Leona CarryLinda M Ambriana Selway, MD 03/25/15 0030

## 2015-03-25 NOTE — [facility] Counselor (Signed)
[facility] by Dr. Jennet MaduroPucilowska. Attending Physician will be Dr. Ardyth HarpsHernandez.  Pt. [facility] 311[facility] Jerry CarasWendy S.,.  Intake Paper Work has been signed and placed on pt. chart. ER staff Misty Stanley(Lisa, ER Sect., Dr. Scotty CourtStafford ER MD & Floyce StakesMary N, RN) have been made aware of the admission.

## 2015-03-25 NOTE — ED Notes (Signed)
[facility]

## 2015-03-25 NOTE — [facility] Group Notes (Signed)
[facility] Group Notes:  (Nursing/MHT/Case Management/Adjunct)  Date:  03/25/2015  Time:  1:43 PM  Type of Therapy:  Group Therapy  Participation Level:  Active  Participation Quality:  Appropriate, Attentive and Supportive  Affect:  Appropriate  Cognitive:  Alert, Appropriate and Oriented  Insight:  Appropriate  Engagement in Group:  Engaged  Modes of Intervention:  Discussion  Summary of Progress/Problems:  Ryan Mccormick Ryan Mccormick Ryan Mccormick 03/25/2015, 1:43 PM

## 2015-03-25 NOTE — Progress Notes (Signed)
Patient alert and oriented x 4 and is calm and cooperative behavior with admission interview and assessment. Denies SI/HI at this time. Skin check performed by staff with no wounds or contraband found. Belonging check performed with cigarettes found and placed in locker. Patient admitted following argument with Mother and states his girlfriend is a positive support. [facility]. Poor insight into alcohol and Xanax use. Poor coping skills. Encouraged to attend therapy groups and to follow MD recommendations for plan of care. Safety maintained.

## 2015-03-25 NOTE — ED Notes (Signed)
BEHAVIORAL HEALTH ROUNDING Patient sleeping: No. Patient alert and oriented: yes Behavior appropriate: Yes.   Nutrition and fluids offered: Yes  Toileting and hygiene offered: Yes  Sitter present: q15 min observations and security camera monitoring Law enforcement present: Yes [employer]  ENVIRONMENTAL ASSESSMENT Potentially harmful objects out of patient reach: Yes.   Personal belongings secured: Yes.   Patient dressed in hospital provided attire only: Yes.   Plastic bags out of patient reach: Yes.   Patient care equipment removed: Yes.   Equipment and supplies removed: Yes.   Potentially toxic materials out of patient reach: Yes.   Sharps container removed or out of patient reach: Yes.    Pt brought into ED BHU via sally port by RN Brandy C., and wanded with metal detector for safety by [employer] officer. Patient oriented to unit/care area: Pt informed of unit policies and procedures.  Informed that, for their safety, care areas are designed for safety and monitored by security cameras at all times; and visiting hours explained to patient. Patient verbalizes understanding, and verbal contract for safety obtained.Pt shown to their room.  

## 2015-03-26 MED ORDER — CHLORDIAZEPOXIDE HCL 10 MG PO CAPS
10.0000 mg | ORAL_CAPSULE | Freq: Four times a day (QID) | ORAL | Status: AC
  Administered 2015-03-26 – 2015-03-28 (×6): 10 mg via ORAL
  Filled 2015-03-26 (×7): qty 1

## 2015-03-26 NOTE — [facility] Group Notes (Signed)
[facility] Group Notes:  (Nursing/MHT/Case Management/Adjunct)  Date:  03/26/2015  Time:  11:30 AM  Type of Therapy:  Movement Therapy  Participation Level:  Active  Participation Quality:  Appropriate, Attentive and Supportive  Affect:  Appropriate  Cognitive:  Alert, Appropriate and Oriented  Insight:  Appropriate  Engagement in Group:  Engaged  Modes of Intervention:  Activity  Summary of Progress/Problems:  Ryan Mccormick Ryan Mccormick 03/26/2015, 11:30 AM

## 2015-03-26 NOTE — Progress Notes (Signed)
D) Patient pleasant and cooperative upon my assessment. Patient did complete Self Inventory Assessment and rates depression as 8/10, patient rates hopeless feelings as 6/10.  Patient denies SI/HI, denies A/V hallucinations.  Patient's affect is appropriate and mood is sad/depressed.  Reports that his appetite is good.   A) Patient offered support and encouragement, patient encouraged to discuss feelings/concerns with staff. Patient verbalized understanding. Patient monitored Q15 minutes for safety. Patient met with MD  to discuss today's goals and plan of care.  R) Patient visible in milieu, he has  attended groups today.  Patient come to dining room for meals and snacks. Patient appropriate with staff and peers.   Patient taking medications as ordered. Will continue to monitor.

## 2015-03-26 NOTE — Plan of Care (Signed)
Problem: Diagnosis: Increased Risk For Suicide Attempt Goal: LTG-Patient Family Informed of Increased Suicide Risk LTG (by discharge) Patient's family or significant other informed of patient's increased risk for suicide and they will be able to verbalize ways they can help the patient stay safe after discharge.  Outcome: Progressing Patient denies SI/HI, 15 minutes checks maintained.

## 2015-03-26 NOTE — [facility] Group Notes (Signed)
[facility] LCSW Group Therapy  03/26/2015 3:14 PM  Type of Therapy:  Group Therapy  Participation Level:  Minimal  Participation Quality:  Drowsy and Inattentive  Affect:  Flat  Cognitive:  Disorganized  Insight:  Limited  Engagement in Therapy:  Limited  Modes of Intervention:  Discussion, Education, Socialization and Support  Summary of Progress/Problems:Feelings around Relapse. Group members discussed the meaning of relapse and shared personal stories of relapse, how it affected them and others, and how they perceived themselves during this time. Group members were encouraged to identify triggers, warning signs and coping skills used when facing the possibility of relapse. Social supports were discussed and explored in detail.Ryan BosworthCarlos states he feels "normal" when he is doing drugs. He reports using many drugs to feel "happy." During group, he had difficulties staying on topic. However, he stated that he is going to a substance abuse program because he wants to be a better father.   [employer] EnergyCandace L Colton Tassin MSW, LCSWA  03/26/2015, 3:14 PM

## 2015-03-26 NOTE — Progress Notes (Signed)
[facility] MD Progress Note  03/26/2015 4:57 PM Ryan Mccormick  MRN:  [id number]  Subjective:  Ryan Mccormick is a 19 year old male with history of depression, mood instability and polysubstance dependence admitted after a suicide attempt by Xanax overdose. The patient still feels depressed today and suicidal. He participates in groups and seemingly is gaining some insight into his problems. He spent this morning with Sherrian Divers, [place] liaison, and talking about her substance use. For months he has been using daily and is "high" all the time. He is interested in [facility] IOP participation. Sleep and appetite are good. There are no somatic complaints.  Principal Problem: Bipolar I disorder, most recent episode depressed Diagnosis:   Patient Active Problem List   Diagnosis Date Noted  . Bipolar I disorder, most recent episode depressed [F31.30] 03/25/2015  . 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 Medical history non-contributory    History reviewed. No pertinent past surgical history. Family History: History reviewed. No pertinent family history. Social History:  History  Alcohol Use No     History  Drug Use  . Yes  . Special: Marijuana    History   Social History  . Marital Status: Single    Spouse Name: N/A  . Number of Children: N/A  . Years of Education: N/A   Social History Main Topics  . Smoking status: Current Every Day Smoker    Types: Cigarettes  . Smokeless tobacco: Not on file  . Alcohol Use: No  . Drug Use: Yes    Special: Marijuana  . Sexual Activity: Yes   Other Topics Concern  . None   Social History Narrative   Additional History:    Sleep: Good  Appetite:  Good   Assessment:   Musculoskeletal: Strength & Muscle Tone: within normal limits Gait & Station: normal Patient leans:  N/A   Psychiatric Specialty Exam: Physical Exam  Nursing note and vitals reviewed.   Review of Systems  All other systems reviewed and are negative.   Blood pressure 119/65, pulse 73, temperature 98.2 F (36.8 C), temperature source Oral, resp. rate 20, height 5' 8"  (1.727 m), weight 77.565 kg (171 lb).Body mass index is 26.01 kg/(m^2).  General Appearance: Casual  Eye Contact::  Good  Speech:  Normal Rate  Volume:  Normal  Mood:  Depressed  Affect:  Depressed  Thought Process:  Goal Directed  Orientation:  Full (Time, Place, and Person)  Thought Content:  WDL  Suicidal Thoughts:  Yes.  with intent/plan  Homicidal Thoughts:  No  Memory:  Immediate;   Fair Recent;   Fair Remote;   Fair  Judgement:  Fair  Insight:  Fair  Psychomotor Activity:  Normal  Concentration:  Fair  Recall:  [employer] [place]  Language: Fair  Akathisia:  No  Handed:  Right  AIMS (if indicated):     Assets:  Communication Skills Desire for Improvement Financial Resources/Insurance Housing Physical Health  ADL's:  Intact  Cognition: WNL  Sleep:  Number of Hours: 7     Current Medications: Current Facility-Administered Medications  Medication Dose Route Frequency Provider Last Rate Last Dose  . acetaminophen (TYLENOL) tablet 650 mg  650 mg Oral Q6H PRN Atley Scarboro B Chakira Jachim, MD      . alum & mag hydroxide-simeth (MAALOX/MYLANTA) 200-200-20 MG/5ML suspension 30 mL  30 mL  Oral Q4H PRN Clovis Fredrickson, MD      . carbamazepine (TEGRETOL) tablet 200 mg  200 mg Oral BID Seleena Reimers B Bryna Razavi, MD   200 mg at 03/26/15 0800  . chlordiazePOXIDE (LIBRIUM) capsule 25 mg  25 mg Oral QID Cantrell Martus B Shayma Pfefferle, MD   25 mg at 03/26/15 1500  . magnesium hydroxide (MILK OF MAGNESIA) suspension 30 mL  30 mL Oral Daily PRN Rondle Lohse B Oluwademilade Mckiver, MD      . sertraline (ZOLOFT) tablet 50 mg  50 mg Oral Daily Bentlie Withem B Riti Rollyson, MD   50 mg at 03/26/15 1010  . traZODone (DESYREL) tablet 100 mg  100 mg  Oral QHS Clovis Fredrickson, MD   100 mg at 03/25/15 2200    Lab Results:  Results for orders placed or performed during the hospital encounter of 03/24/15 (from the past 48 hour(s))  CBC     Status: None   Collection Time: 03/24/15 11:31 PM  Result Value Ref Range   WBC 7.3 3.8 - 10.6 K/uL   RBC 5.85 4.40 - 5.90 MIL/uL   Hemoglobin 17.4 13.0 - 18.0 g/dL   HCT 51.3 40.0 - 52.0 %   MCV 87.7 80.0 - 100.0 fL   MCH 29.7 26.0 - 34.0 pg   MCHC 33.9 32.0 - 36.0 g/dL   RDW 12.8 11.5 - 14.5 %   Platelets 214 150 - 440 K/uL  Comprehensive metabolic panel     Status: None   Collection Time: 03/24/15 11:31 PM  Result Value Ref Range   Sodium 140 135 - 145 mmol/L   Potassium 3.9 3.5 - 5.1 mmol/L   Chloride 103 101 - 111 mmol/L   CO2 28 22 - 32 mmol/L   Glucose, Bld 84 65 - 99 mg/dL   BUN 15 6 - 20 mg/dL   Creatinine, Ser 1.05 0.61 - 1.24 mg/dL   Calcium 9.5 8.9 - 10.3 mg/dL   Total Protein 7.8 6.5 - 8.1 g/dL   Albumin 4.7 3.5 - 5.0 g/dL   AST 24 15 - 41 U/L   ALT 19 17 - 63 U/L   Alkaline Phosphatase 100 38 - 126 U/L   Total Bilirubin 0.6 0.3 - 1.2 mg/dL   GFR calc non Af Amer >60 >60 mL/min   GFR calc Af Amer >60 >60 mL/min    Comment: (NOTE) The eGFR has been calculated using the CKD EPI equation. This calculation has not been validated in all clinical situations. eGFR's persistently <60 mL/min signify possible Chronic Kidney Disease.    Anion gap 9 5 - 15  Ethanol (ETOH)     Status: None   Collection Time: 03/24/15 11:31 PM  Result Value Ref Range   Alcohol, Ethyl (B) <5 <5 mg/dL    Comment:        LOWEST DETECTABLE LIMIT FOR SERUM ALCOHOL IS 5 mg/dL FOR MEDICAL PURPOSES ONLY   Acetaminophen level     Status: Abnormal   Collection Time: 03/24/15 11:31 PM  Result Value Ref Range   Acetaminophen (Tylenol), Serum <10 (L) 10 - 30 ug/mL    Comment:        THERAPEUTIC CONCENTRATIONS VARY SIGNIFICANTLY. A RANGE OF 10-30 ug/mL MAY BE AN EFFECTIVE CONCENTRATION FOR MANY  PATIENTS. HOWEVER, SOME ARE BEST TREATED AT CONCENTRATIONS OUTSIDE THIS RANGE. ACETAMINOPHEN CONCENTRATIONS >150 ug/mL AT 4 HOURS AFTER INGESTION AND >50 ug/mL AT 12 HOURS AFTER INGESTION ARE OFTEN ASSOCIATED WITH TOXIC REACTIONS.   Salicylate level  Status: None   Collection Time: 03/24/15 11:31 PM  Result Value Ref Range   Salicylate Lvl <5.8 2.8 - 30.0 mg/dL  Urine Drug Screen, Qualitative ([facility] only)     Status: Abnormal   Collection Time: 03/24/15 11:32 PM  Result Value Ref Range   Tricyclic, Ur Screen NONE DETECTED NONE DETECTED   Amphetamines, Ur Screen NONE DETECTED NONE DETECTED   MDMA (Ecstasy)Ur Screen NONE DETECTED NONE DETECTED   Cocaine Metabolite,Ur [place] NONE DETECTED NONE DETECTED   Opiate, Ur Screen NONE DETECTED NONE DETECTED   Phencyclidine (PCP) Ur S NONE DETECTED NONE DETECTED   Cannabinoid 50 Ng, Ur [place] POSITIVE (A) NONE DETECTED   Barbiturates, Ur Screen NONE DETECTED NONE DETECTED   Benzodiazepine, Ur Scrn POSITIVE (A) NONE DETECTED   Methadone Scn, Ur NONE DETECTED NONE DETECTED    Comment: (NOTE) 251  Tricyclics, urine               Cutoff 1000 ng/mL 200  Amphetamines, urine             Cutoff 1000 ng/mL 300  MDMA (Ecstasy), urine           Cutoff 500 ng/mL 400  Cocaine Metabolite, urine       Cutoff 300 ng/mL 500  Opiate, urine                   Cutoff 300 ng/mL 600  Phencyclidine (PCP), urine      Cutoff 25 ng/mL 700  Cannabinoid, urine              Cutoff 50 ng/mL 800  Barbiturates, urine             Cutoff 200 ng/mL 900  Benzodiazepine, urine           Cutoff 200 ng/mL 1000 Methadone, urine                Cutoff 300 ng/mL 1100 1200 The urine drug screen provides only a preliminary, unconfirmed 1300 analytical test result and should not be used for non-medical 1400 purposes. Clinical consideration and professional judgment should 1500 be applied to any positive drug screen result due to possible 1600 interfering substances. A more specific  alternate chemical method 1700 must be used in order to obtain a confirmed analytical result.  1800 Gas chromato graphy / mass spectrometry (GC/MS) is the preferred 1900 confirmatory method.     Physical Findings: AIMS: Facial and Oral Movements Muscles of Facial Expression: None, normal Lips and Perioral Area: None, normal Jaw: None, normal Tongue: None, normal,Extremity Movements Upper (arms, wrists, hands, fingers): None, normal Lower (legs, knees, ankles, toes): None, normal, Trunk Movements Neck, shoulders, hips: None, normal, Overall Severity Severity of abnormal movements (highest score from questions above): None, normal Incapacitation due to abnormal movements: None, normal Patient's awareness of abnormal movements (rate only patient's report): No Awareness, Dental Status Current problems with teeth and/or dentures?: No Does patient usually wear dentures?: No  CIWA:  CIWA-Ar Total: 0 COWS:  COWS Total Score: 1  Treatment Plan Summary: Daily contact with patient to assess and evaluate symptoms and progress in treatment and Medication management   Medical Decision Making:  Established Problem, Stable/Improving (1), Review of Psycho-Social Stressors (1), Review or order clinical lab tests (1), Review of Medication Regimen & Side Effects (2) and Review of New Medication or Change in Dosage (2)   Ryan Mccormick is a 19 year old male with history of depression, anxiety, mood instability, and substance use admitted  after a suicide attempt by Xanax overdose.  1. Suicidal ideation. The patient is able to contract for safety.  2. Mood. We will start Tegretol 200 mg twice daily for mood stabilization and Zoloft 50 mg for depression.  3. Anxiety. The patient has been taking Xanax off the street to treat his anxiety. He takes them daily. Will start Librium taper.  4. Insomnia. We'll start trazodone.  5. Smoking. Nicotine products are available.  6. Disposition. He will be  discharged to home with his family. Follow-up with [facility].     Kristie Bracewell 03/26/2015, 4:57 PM

## 2015-03-26 NOTE — Progress Notes (Signed)
D: Pt denies SI/HI/AVH. Pt is pleasant and cooperative. Patient's affect is bright, speech is non ressured and thoughts are organized. Patient appears less anxious and he is interacting with peers and staff appropriately.  A: Pt was offered support and encouragement. Pt was given scheduled medications. Pt was encouraged to attend groups. Q 15 minute checks were done for safety.  R Pt attends groups and interacts well with peers and staff. Pt is taking medication. Pt has no complaints.Pt receptive to treatment and safety maintained on unit.

## 2015-03-26 NOTE — Plan of Care (Signed)
Problem: Ineffective individual coping Goal: STG: Patient will remain free from self harm Outcome: Progressing Patient has been free from harm  Problem: Diagnosis: Increased Risk For Suicide Attempt Goal: LTG-Patient Will Show Positive Response to Medication LTG (by discharge) : Patient will show positive response to medication and will participate in the development of the discharge plan.  Patient is medication compliant

## 2015-03-27 MED ORDER — SERTRALINE HCL 50 MG PO TABS
25.0000 mg | ORAL_TABLET | Freq: Every day | ORAL | Status: DC
  Administered 2015-03-28 – 2015-03-29 (×2): 25 mg via ORAL
  Filled 2015-03-27 (×3): qty 1

## 2015-03-27 MED ORDER — TRAZODONE HCL 50 MG PO TABS
50.0000 mg | ORAL_TABLET | Freq: Every day | ORAL | Status: DC
  Administered 2015-03-27 – 2015-03-28 (×2): 50 mg via ORAL
  Filled 2015-03-27 (×2): qty 1

## 2015-03-27 NOTE — Progress Notes (Signed)
Patient currently in the day room, visiting with family. Alert and oriented, pleasant. No sign of distress noted. Staff continue to monitor for safety and other needs.

## 2015-03-27 NOTE — Plan of Care (Signed)
Problem: Ineffective individual coping Goal: STG: Patient will remain free from self harm Outcome: Progressing Patient denies SI at this time.     

## 2015-03-27 NOTE — [facility] Group Notes (Signed)
[facility] Group Notes:  (Nursing/MHT/Case Management/Adjunct)  Date:  03/27/2015  Time:  10:28 AM  Type of Therapy:  goal setting   Participation Level:  Active  Participation Quality:  Appropriate  Affect:  Appropriate  Cognitive:  Appropriate  Insight:  Appropriate  Engagement in Group:  Supportive  Modes of Intervention:  goals   Summary of Progress/Problems:  Marquette Oldmanda Lea Campbell 03/27/2015, 10:28 AM

## 2015-03-27 NOTE — Progress Notes (Signed)
Patient is alert and oriented x 4.  No signs of pain or distress noted.  Patient interacting well with peers and staff.  Patient did attend NA meeting today and found the group interesting and states that he wants to work on his drugs use.  Patient is medication compliant at this time.  He also denies any SI/HI/AVH at this time. Patient has several visitors today.

## 2015-03-27 NOTE — Plan of Care (Signed)
Problem: Ineffective individual coping Goal: LTG: Patient will report a decrease in negative feelings Outcome: Progressing Patient affect is brighter, he is interactive and talkative in the milieu. States he is feeling better.  Goal: STG: Patient will remain free from self harm Outcome: Progressing No self-harm.  Goal: STG-Increase in ability to manage activities of daily living Outcome: Progressing Patient took a shower and washed his clothes independently.  Problem: Diagnosis: Increased Risk For Suicide Attempt Goal: LTG-Patient Will Show Positive Response to Medication LTG (by discharge) : Patient will show positive response to medication and will participate in the development of the discharge plan.  Outcome: Progressing Patient is med compliant and notes he can feel a difference with his medications. He notes it is especially nice to have a good night of sleep.

## 2015-03-27 NOTE — Plan of Care (Signed)
Problem: Diagnosis: Increased Risk For Suicide Attempt Goal: LTG-Patient Will Report Improved Mood and Deny Suicidal LTG (by discharge) Patient will report improved mood and deny suicidal ideation.  Outcome: Progressing Patient denies SI. Pleasant and cooperative. Visited with family and appeared to be pleased

## 2015-03-27 NOTE — Progress Notes (Signed)
Patient had his scheduled medications. Stayed in the milieu until bed time and had no concern. Currently in bed sleeping, eyes closed, respirations within normal limit. No sign of discomfort noted. Pt remains safe in room, staff routinely monitoring.

## 2015-03-27 NOTE — [facility] Group Notes (Signed)
[facility] LCSW Group Therapy  03/27/2015 4:57 PM  Type of Therapy:  Group Therapy  Participation Level:  Minimal  Participation Quality:  Attentive  Affect:  Flat  Cognitive:  Disorganized  Insight:  Limited  Engagement in Therapy:  Limited  Modes of Intervention:  Discussion, Education, Socialization and Support  Summary of Progress/Problems: Pt will identify unhealthy thoughts and how they impact their emotions and behavior. Pt will be encouraged to discuss these thoughts, emotions and behaviors with the group. Mikle BosworthCarlos had difficulties staying on topic. However, he states that he is hopeful for his future and he wants to stop using drugs.   Daisy Floroandace L Marcanthony Sleight MSW, LCSWA  03/27/2015, 4:57 PM

## 2015-03-27 NOTE — Progress Notes (Signed)
[facility] MD Progress Note  03/27/2015 10:36 AM Ryan Mccormick  MRN:  [id number]  Subjective:  Ryan Mccormick is a 19 year old male with history of depression, mood instability and polysubstance dependence admitted after a suicide attempt by Xanax overdose. The patient still feels depressed today and suicidal. He participates in groups and seemingly is gaining some insight into his problems. He he tells me he continues to have thoughts of suicide but they are not as severe or intense as they were before his arrival to the hospital. He is states that these thoughts are ego dystonic as he does not plan or wanting to hurt himself. He is states that he has a son on the way and this is his reason for living. Today he complains of headaches dizziness and nausea. Patient denies issues with his sleep, appetite energy or concentration. He denies other side effects from medications he denies homicidal ideation or auditory or visual hallucinations.  Principal Problem: Bipolar I disorder, most recent episode depressed Diagnosis:   Patient Active Problem List   Diagnosis Date Noted  . Bipolar I disorder, most recent episode depressed [F31.30] 03/25/2015  . 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 Medical history non-contributory    History reviewed. No pertinent past surgical history. Family History: History reviewed. No pertinent family history. Social History:  History  Alcohol Use No     History  Drug Use  . Yes  . Special: Marijuana    History   Social History  . Marital Status: Single    Spouse Name: N/A  . Number of Children: N/A  . Years of Education: N/A   Social History Main Topics  . Smoking status: Current Every Day Smoker    Types: Cigarettes  . Smokeless tobacco: Not on file  . Alcohol Use: No  . Drug Use: Yes   Special: Marijuana  . Sexual Activity: Yes   Other Topics Concern  . None   Social History Narrative   Additional History:    Sleep: Good  Appetite:  Good   Assessment:   Musculoskeletal: Strength & Muscle Tone: within normal limits Gait & Station: normal Patient leans: N/A   Psychiatric Specialty Exam: Physical Exam  Nursing note and vitals reviewed.   Review of Systems  Constitutional: Negative.   HENT: Negative.   Eyes: Negative.   Respiratory: Negative.   Cardiovascular: Negative.   Gastrointestinal: Negative.   Genitourinary: Negative.   Musculoskeletal: Negative.   Neurological: Positive for dizziness.  Endo/Heme/Allergies: Negative.   Psychiatric/Behavioral: Positive for depression and suicidal ideas.  All other systems reviewed and are negative.   Blood pressure 147/113, pulse 76, temperature 97.9 F (36.6 C), temperature source Oral, resp. rate 20, height  (1.727 m), weight 77.565 kg (171 lb), SpO2 95 %.Body mass index is 26.01 kg/(m^2).  General Appearance: Casual  Eye Contact::  Good  Speech:  Normal Rate  Volume:  Normal  Mood:  Depressed  Affect:  Depressed  Thought Process:  Goal Directed  Orientation:  Full (Time, Place, and Person)  Thought Content:  WDL  Suicidal Thoughts:  Yes.  with intent/plan  Homicidal Thoughts:  No  Memory:  Immediate;   Fair Recent;   Fair Remote;   Fair  Judgement:  Fair  Insight:  Fair  Psychomotor Activity:  Normal  Concentration:  Fair  Recall:  Fair  Fund of Knowledge:Fair  Language: Fair  Akathisia:  No  Handed:  Right  AIMS (if indicated):     Assets:  Communication Skills Desire for Improvement Financial Resources/Insurance Housing Physical Health  ADL's:  Intact  Cognition: WNL  Sleep:  Number of Hours: 6     Current Medications: Current Facility-Administered Medications  Medication Dose Route Frequency Provider Last Rate Last Dose  . acetaminophen (TYLENOL) tablet 650 mg  650 mg  Oral Q6H PRN Jolanta B Pucilowska, MD      . alum & mag hydroxide-simeth (MAALOX/MYLANTA) 200-200-20 MG/5ML suspension 30 mL  30 mL Oral Q4H PRN Jolanta B Pucilowska, MD      . carbamazepine (TEGRETOL) tablet 200 mg  200 mg Oral BID Shari ProwsJolanta B Pucilowska, MD   200 mg at 03/27/15 0801  . chlordiazePOXIDE (LIBRIUM) capsule 10 mg  10 mg Oral QID Shari ProwsJolanta B Pucilowska, MD   10 mg at 03/27/15 1015  . magnesium hydroxide (MILK OF MAGNESIA) suspension 30 mL  30 mL Oral Daily PRN Jolanta B Pucilowska, MD      . sertraline (ZOLOFT) tablet 50 mg  50 mg Oral Daily Jolanta B Pucilowska, MD   50 mg at 03/27/15 1015  . traZODone (DESYREL) tablet 100 mg  100 mg Oral QHS Shari ProwsJolanta B Pucilowska, MD   100 mg at 03/26/15 2137    Lab Results:  No results found for this or any previous visit (from the past 48 hour(s)).  Physical Findings: AIMS: Facial and Oral Movements Muscles of Facial Expression: None, normal Lips and Perioral Area: None, normal Jaw: None, normal Tongue: None, normal,Extremity Movements Upper (arms, wrists, hands, fingers): None, normal Lower (legs, knees, ankles, toes): None, normal, Trunk Movements Neck, shoulders, hips: None, normal, Overall Severity Severity of abnormal movements (highest score from questions above): None, normal Incapacitation due to abnormal movements: None, normal Patient's awareness of abnormal movements (rate only patient's report): No Awareness, Dental Status Current problems with teeth and/or dentures?: No Does patient usually wear dentures?: No  CIWA:  CIWA-Ar Total: 0 COWS:  COWS Total Score: 1  Treatment Plan Summary: Daily contact with patient to assess and evaluate symptoms and progress in treatment and Medication management   Medical Decision Making:  Established Problem, Stable/Improving (1), Review of Psycho-Social Stressors (1), Review or order clinical lab tests (1), Review of Medication Regimen & Side Effects (2) and Review of New Medication or  Change in Dosage (2)   Ryan Mccormick is a 19 year old male with history of depression, anxiety, mood instability, and substance use admitted after a suicide attempt by Xanax overdose.  1. Suicidal ideation. The patient is able to contract for safety.  2. Mood. We will start Tegretol 200 mg twice daily for mood stabilization and Zoloft. I will lower the dose of Zoloft to 25 mg a day as patient is complaining of nausea.  3. Anxiety. The patient has been taking Xanax off the street to treat his anxiety. He takes them daily. Will start Librium taper.  4. Insomnia: I will lower the dose of trazodone as patient is complaining of dizziness and lightheadedness.  5. Smoking. Nicotine products are available.  6. Disposition. He will be discharged to home with his family. Follow-up with [facility].     Ryan Mccormick,  Ryan Mccormick 03/27/2015, 10:36 AM

## 2015-03-27 NOTE — Plan of Care (Signed)
Problem: Diagnosis: Increased Risk For Suicide Attempt Goal: LTG-Patient Will Show Positive Response to Medication LTG (by discharge) : Patient will show positive response to medication and will participate in the development of the discharge plan.  Outcome: Progressing Patient taking medications as scheduled: reports feeling improvement in thought process
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
## 2015-03-27 NOTE — B[facility] Counselor (Deleted)
Adult Comprehensive Assessment  Patient ID: Ryan Mccormick, male   DOB: 09-02-1996, 19 y.o.   MRN: [id number]  Information Source:    Current Stressors:     Living/Environment/Situation:  Living Arrangements: Parent  Family History:     Childhood History:     Education:     Employment/Work Situation:      Surveyor, quantityinancial Resources:      Alcohol/Substance Abuse:      Social Support System:      Leisure/Recreation:      Strengths/Needs:      Discharge Plan:      Summary/Recommendations:      Garment/textile technologistCandace L Alazar Cherian. 03/27/2015

## 2015-03-28 NOTE — Progress Notes (Signed)
Patient is alert and oriented x 4. He is still experiencing some mood lability but feels he is less depressed that at admission. Currently denies SI or HI. No evidence of psychotic thinking. His speech is clear and coherent, thoughts are organized and linear. Patient reports that he is not experiencing any withdrawal symptoms from Xanax.  Patient readily discussing his issues and reasons for admission with staff. He was receptive to teaching regarding coping skills. He is future oriented, plans to continue treatment as an outpatient and live with a supportive relative.  Patient has been attending groups and has been compliant with all nursing interventions. Will continue with current treatment plan, monitor response, and maintain on 15 minute safety checks.

## 2015-03-28 NOTE — Progress Notes (Signed)
[facility] MD Progress Note  03/28/2015 10:31 AM Ryan Mccormick  MRN:  [id number]  Subjective:  Ryan Mccormick is a 19 year old male with history of depression, mood instability and polysubstance dependence admitted after a suicide attempt by Xanax overdose. Yesterday  patient still reported  feeling depressed  and suicidal.He is stated that those thoughts were ego dystonic as he does not plan or wanting to hurt himself. He stated that he has a son on the way and this is his reason for living. Today he denies having depressed mood, suicidality, homicidality. He denies having any issues and feels ready for discharge. Patient denies issues with his sleep, appetite energy or concentration. He denies other side effects from medications he denies homicidal ideation or auditory or visual hallucinations.  Per nursing: Patient is alert and oriented x 4. He is still experiencing some mood lability but feels he is less depressed that at admission. Currently denies SI or HI. No evidence of psychotic thinking. His speech is clear and coherent, thoughts are organized and linear. Patient reports that he is not experiencing any withdrawal symptoms from Xanax.  Patient readily discussing his issues and reasons for admission with staff. He was receptive to teaching regarding coping skills. He is future oriented, plans to continue treatment as an outpatient and live with a supportive relative.  Patient has been attending groups and has been compliant with all nursing interventions. Will continue with current treatment plan, monitor response, and maintain on 15 minute safety checks.  Principal Problem: Bipolar I disorder, most recent episode depressed Diagnosis:   Patient Active Problem List   Diagnosis Date Noted  . Bipolar I disorder, most recent episode depressed [F31.30] 03/25/2015  . 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 Medical history non-contributory    History reviewed. No pertinent past surgical history. Family History: History reviewed. No pertinent family history. Social History:  History  Alcohol Use No     History  Drug Use  . Yes  . Special: Marijuana    History   Social History  . Marital Status: Single    Spouse Name: N/A  . Number of Children: N/A  . Years of Education: N/A   Social History Main Topics  . Smoking status: Current Every Day Smoker    Types: Cigarettes  . Smokeless tobacco: Not on file  . Alcohol Use: No  . Drug Use: Yes    Special: Marijuana  . Sexual Activity: Yes   Other Topics Concern  . None   Social History Narrative   Additional History:    Sleep: Good  Appetite:  Good   Assessment:   Musculoskeletal: Strength & Muscle Tone: within normal limits Gait & Station: normal Patient leans: N/A   Psychiatric Specialty Exam: Physical Exam  Nursing note and vitals reviewed.   Review of Systems  Constitutional: Negative.   HENT: Negative.   Eyes: Negative.   Respiratory: Negative.   Cardiovascular: Negative.   Gastrointestinal: Negative.   Genitourinary: Negative.   Musculoskeletal: Negative.   Neurological: Negative for dizziness.  Endo/Heme/Allergies: Negative.   Psychiatric/Behavioral: Negative for depression and suicidal ideas.  All other systems reviewed and are negative.   Blood pressure 128/89, pulse 69, temperature 98.6 F (37 C), temperature source Oral, resp. rate 20, height  (1.727 m), weight 77.565 kg (171 lb), SpO2 95 %.Body mass index  is 26.01 kg/(m^2).  General Appearance: Casual  Eye Contact::  Good  Speech:  Normal Rate  Volume:  Normal  Mood:  Depressed  Affect:  Depressed  Thought Process:  Goal Directed  Orientation:  Full (Time, Place, and Person)  Thought Content:  WDL  Suicidal Thoughts:  Yes.  with intent/plan  Homicidal  Thoughts:  No  Memory:  Immediate;   Fair Recent;   Fair Remote;   Fair  Judgement:  Fair  Insight:  Fair  Psychomotor Activity:  Normal  Concentration:  Fair  Recall:  [employer] of Knowledge:Fair  Language: Fair  Akathisia:  No  Handed:  Right  AIMS (if indicated):     Assets:  Communication Skills Desire for Improvement Financial Resources/Insurance Housing Physical Health  ADL's:  Intact  Cognition: WNL  Sleep:  Number of Hours: 6     Current Medications: Current Facility-Administered Medications  Medication Dose Route Frequency Provider Last Rate Last Dose  . acetaminophen (TYLENOL) tablet 650 mg  650 mg Oral Q6H PRN Jolanta B Pucilowska, MD      . alum & mag hydroxide-simeth (MAALOX/MYLANTA) 200-200-20 MG/5ML suspension 30 mL  30 mL Oral Q4H PRN Jolanta B Pucilowska, MD      . carbamazepine (TEGRETOL) tablet 200 mg  200 mg Oral BID Shari Prows, MD   200 mg at 03/28/15 0758  . chlordiazePOXIDE (LIBRIUM) capsule 10 mg  10 mg Oral QID Shari Prows, MD   10 mg at 03/27/15 2113  . magnesium hydroxide (MILK OF MAGNESIA) suspension 30 mL  30 mL Oral Daily PRN Jolanta B Pucilowska, MD      . sertraline (ZOLOFT) tablet 25 mg  25 mg Oral Daily Jimmy Footman, MD      . traZODone (DESYREL) tablet 50 mg  50 mg Oral QHS Jimmy Footman, MD   50 mg at 03/27/15 2113    Lab Results:  No results found for this or any previous visit (from the past 48 hour(s)).  Physical Findings: AIMS: Facial and Oral Movements Muscles of Facial Expression: None, normal Lips and Perioral Area: None, normal Jaw: None, normal Tongue: None, normal,Extremity Movements Upper (arms, wrists, hands, fingers): None, normal Lower (legs, knees, ankles, toes): None, normal, Trunk Movements Neck, shoulders, hips: None, normal, Overall Severity Severity of abnormal movements (highest score from questions above): None, normal Incapacitation due to abnormal movements: None,  normal Patient's awareness of abnormal movements (rate only patient's report): No Awareness, Dental Status Current problems with teeth and/or dentures?: No Does patient usually wear dentures?: No  CIWA:  CIWA-Ar Total: 0 COWS:  COWS Total Score: 1  Treatment Plan Summary: Daily contact with patient to assess and evaluate symptoms and progress in treatment and Medication management   Medical Decision Making:  Established Problem, Stable/Improving (1), Review of Psycho-Social Stressors (1), Review or order clinical lab tests (1), Review of Medication Regimen & Side Effects (2) and Review of New Medication or Change in Dosage (2)   Ryan Mccormick is a 19 year old male with history of depression, anxiety, mood instability, and substance use admitted after a suicide attempt by Xanax overdose.  1. Suicidal ideation. The patient is able to contract for safety.  2. Mood. We will start Tegretol 200 mg twice daily for mood stabilization and Zoloft. I will lower the dose of Zoloft to 25 mg a day as patient is complaining of nausea.  3. Anxiety. The patient has been taking Xanax off the street to treat his anxiety. He  takes them daily. Will start Librium taper.  4. Insomnia: Dose of trazodone was decreased to 50 mg daily at bedtime as he completed yesterday all having dizziness  5. Smoking. Nicotine products are available.  6. Disposition. He will be discharged to home with his family. Follow-up with [facility].     Jimmy FootmanHernandez-Gonzalez,  Koah Chisenhall 03/28/2015, 10:31 AM

## 2015-03-28 NOTE — [facility] Group Notes (Signed)
[facility] LCSW Group Therapy  03/28/2015 9:18 PM  Type of Therapy:  Group Therapy  Participation Level:  Active  Participation Quality:  Attentive  Affect:  Flat  Cognitive:  Alert  Insight:  Limited  Engagement in Therapy:  Limited  Modes of Intervention:  Discussion, Education, Problem-solving, Socialization and Support  Summary of Progress/Problems: Overcoming obstacles: Pt was challenged to identify obstacles faced currently and processed barriers involved in overcoming these obstacles. Group members were asked to process the steps necessary for overcoming obstacles and explored motivation (external and internal) for facing these difficulties head on. Pt was asked to identify at least one area of concern and choose a skill of focus pulled from their "tool box." Ryan Mccormick identified his main obstacles as his drug use. He wants to provide a better childhood for his child than what he had. [facility] [employer].   Ryan Mccormick Ryan Mccormick MSW, LCSWA  03/28/2015, 9:18 PM

## 2015-03-29 MED ORDER — CARBAMAZEPINE 200 MG PO TABS: 200.0000 mg | ORAL_TABLET | Freq: Two times a day (BID) | ORAL | Status: AC

## 2015-03-29 MED ORDER — SERTRALINE HCL 25 MG PO TABS: 25.0000 mg | ORAL_TABLET | Freq: Every day | ORAL | Status: AC

## 2015-03-29 MED ORDER — TRAZODONE HCL 50 MG PO TABS: 50.0000 mg | ORAL_TABLET | Freq: Every day | ORAL | Status: DC

## 2015-03-29 MED ORDER — CARBAMAZEPINE 200 MG PO TABS: 200.0000 mg | ORAL_TABLET | Freq: Two times a day (BID) | ORAL | Status: DC

## 2015-03-29 MED ORDER — TRAZODONE HCL 50 MG PO TABS: 50.0000 mg | ORAL_TABLET | Freq: Every day | ORAL | Status: AC

## 2015-03-29 MED ORDER — SERTRALINE HCL 25 MG PO TABS: 25.0000 mg | ORAL_TABLET | Freq: Every day | ORAL | Status: DC

## 2015-03-29 NOTE — Progress Notes (Signed)
[facility] H&P Discharge Summary faxed to [facility] for hospital follow-up °

## 2015-03-29 NOTE — Progress Notes (Signed)
  [facility] :  Will you be returning to the same living situation after discharge:  Yes,  home At discharge, do you have transportation home?: Yes,  patient's aunt will pick up at discharge Do you have the ability to pay for your medications: No. Patient is referred to [facility]  Release of information consent forms completed and in the chart;  Patient's signature needed at discharge.  Patient to Follow up at: Follow-up Information    Follow up with [facility]. Go in 2 days.   Why:  For follow-up care; walk in appointments MWF 8am-3pm; patient followup Wednesday 03/31/15 at 7:00am   Contact information:   [address] [place] Ph [phone] Fax [phone]      Patient denies SI/HI: Yes,  patient deneis SI/HI    Safety Planning and Suicide Prevention discussed: Yes,  SPE discussed with patient and his aunt Ledon SnareJennifer Suarez  Have you used any form of tobacco in the last 30 days? (Cigarettes, Smokeless Tobacco, Cigars, and/or Pipes): Yes  Has patient been referred to the Quitline?: Patient refused referral  Lulu Ridingngle, Deryk Bozman T, MSW, LCSWA 03/29/2015, 11:09 AM

## 2015-03-29 NOTE — B[facility] Suicide Risk Assessment (Signed)
[facility] INPATIENT:  Family/Significant Other Suicide Prevention Education  Suicide Prevention Education:  Education Completed; Ledon SnareJennifer Suarez (aunt) [phone] has been identified by the patient as the family member/significant other with whom the patient will be residing, and identified as the person(s) who will aid the patient in the event of a mental health crisis (suicidal ideations/suicide attempt).  With written consent from the patient, the family member/significant other has been provided the following suicide prevention education, prior to the and/or following the discharge of the patient.  The suicide prevention education provided includes the following:  Suicide risk factors  Suicide prevention and interventions  National Suicide Hotline telephone number  [facility] assessment telephone number  [facility] Emergency Assistance 911  [facility] and/or Residential Mobile Crisis Unit telephone number  Request made of family/significant other to:  Remove weapons (e.g., guns, rifles, knives), all items previously/currently identified as safety concern.    Remove drugs/medications (over-the-counter, prescriptions, illicit drugs), all items previously/currently identified as a safety concern.  The family member/significant other verbalizes understanding of the suicide prevention education information provided.  The family member/significant other agrees to remove the items of safety concern listed above.  Lulu RidingIngle, Collis Thede T, MSW, LCSWA 03/29/2015, 10:03 AM

## 2015-03-29 NOTE — Progress Notes (Signed)
Pt visible on the unit. In good spirits. Little interaction noted with others. Looking forward to going home. States he is feeling better. Voices no complaint. Med compliant.

## 2015-03-29 NOTE — Discharge Summary (Signed)
Physician Discharge Summary Note  Patient:  Ryan Mccormick is an 19 y.o., male MRN:  [id number] DOB:  May 19, 1996 Patient phone:  [phone] (home)  Patient address:   [address] [place] [address],  Total Time spent with patient: 30 minutes  Date of Admission:  03/25/2015 Date of Discharge: 03/29/2015  Reason for Admission:  Suicide attempt by overdose.  Identifying data. Mr. Ryan Mccormick is a 19 year old male with history of depression, anxiety, mood instability, and substance use.  Chief complaint. "My mother called me."  History of present illness. Mr. Ryan Mccormick was diagnosed with ADHD in his childhood but never treated. In his teens he developed severe anxiety with frequent panic attacks. He has never seen a psychiatrist and has never had any prescribed medications but started using Xanax is off the street. He believes that in order to function at all he needs to take Xanax all the time. In addition he's been using other drugs and on Sunday went on a drug binge with Xanax, Klonopin, acid, Molly's, and alcohol. On Tuesday he received a phone call from his mother with whom he has not talked for 6 or so months. She told him that she didn't like him and she wished he were never born. He got upset to goal the Xanax as he had on him at [age] of unknown strength and was drinking. His friends and he is and his cousin were present and they brought him to the emergency room. He does not remember much from that point on. He woke up in the emergency room. He denies feeling suicidal now and does not believe that this was a suicide attempt and that he acted on an impulse. He reports many symptoms of depression with extremely poor sleep, decreased appetite reportedly has not eaten in 3 weeks, social isolation, crying spells, anhedonia, feeling of guilt and hopelessness worthlessness, extreme mood swings, and heightened anxiety, and passing suicidal thoughts. He overdosed several times in the  past but has never come to the hospital. He reports periods of severe insomnia hyperactivity out of character behavior with break psychotic symptoms hearing voices that are not there and seeing shadows. He does not drink alcohol. He admits that he is using Xanax on a regular basis. Infrequently when partly uses other substance including cocaine.  Past psychiatric history. He was diagnosed with ADHD and while in school but never treated. No hospital admissions no confirmed suicide attempts, no substance abuse treatments.  family psychiatric history. Multiple family members on his father's side with depression and bipolar including grandmother and grandfather. On his mother's side there is a grandmother with bipolar disorder. Apparently his mother is not well either.  Social history. He dropped out of high school just now. This was mostly because he was using Xanax and did not want to get in trouble. He will go to community college for his GED's. He did not do well in school. He currently lives with his aunt. He works as a Arboriculturist at [employer]. He has a girlfriend and a group of friends   Principal Problem: Bipolar I disorder, most recent episode depressed Discharge Diagnoses: Patient Active Problem List   Diagnosis Date Noted  . Bipolar I disorder, most recent episode depressed [F31.30] 03/25/2015  . 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   Review of Systems  All other systems reviewed and are negative.   Blood pressure 129/83, pulse 67, temperature 98.4 F (36.9 C), temperature source Oral, resp. rate 20, height 5\' 8"  (1.727 m), weight 77.565 kg (171 lb), SpO2 95 %.Body mass index is 26.01 kg/(m^2).  See SRA.                                                   Sleep:  Number of Hours: 7   Have you used any form of tobacco in the last 30 days? (Cigarettes, Smokeless Tobacco, Cigars, and/or Pipes): Yes  Has this patient used any form of tobacco in the last 30 days? (Cigarettes, Smokeless Tobacco, Cigars, and/or Pipes) Yes, A prescription for an FDA-approved tobacco cessation medication was offered at discharge and the patient refused  Past Medical History:  Past Medical History  Diagnosis Date  . Medical history non-contributory    History reviewed. No pertinent past surgical history. Family History: History reviewed. No pertinent family history. Social History:  History  Alcohol Use No     History  Drug Use  . Yes  . Special: Marijuana    History   Social History  . Marital Status: Single    Spouse Name: N/A  . Number of Children: N/A  . Years of Education: N/A   Social History Main Topics  . Smoking status: Current Every Day Smoker    Types: Cigarettes  . Smokeless tobacco: Not on file  . Alcohol Use: No  . Drug Use: Yes    Special: Marijuana  . Sexual Activity: Yes   Other Topics Concern  . None   Social History Narrative    Past Psychiatric History: Hospitalizations:  Outpatient Care:  Substance Abuse Care:  Self-Mutilation:  Suicidal Attempts:  Violent Behaviors:   Risk to Self: Is patient at risk for suicide?: No (denies at this time) What has been your use of drugs/alcohol within the last 12 months?: Cocaine, Molly, Acid, Xanax,  Klonopin, marijuana at least weekly.  Risk to Others:   Prior Inpatient Therapy:   Prior Outpatient Therapy:    Level of Care:  OP  Hospital Course:    Mr. Ryan Mccormick is a 19 year old male with history of depression, anxiety, mood instability, and substance use admitted after a suicide attempt by Xanax overdose.  1. Suicidal ideation. This has resolved .The patient is able to contract for safety.  2. Mood. We startED Tegretol  200 mg twice daily for mood stabilization and Zoloft 25 mg for depression.  3. Benzodiazepine dependence. The patient has been taking Xanax off the street to treat his anxiety. He takes them daily. He completed a brief Librium taper. Vital signs were stable.   4. Insomnia. We startED trazodone.  5. Smoking. Nicotine products are available.  6. Disposition. He was discharged to home with his family. Follow-up with [facility].   Consults:  None  Significant Diagnostic Studies:  None  Discharge Vitals:   Blood pressure 129/83, pulse 67, temperature 98.4 F (36.9 C), temperature source Oral, resp. rate 20, height 5\' 8"  (1.727 m), weight 77.565 kg (171 lb), SpO2 95 %. Body mass index is 26.01 kg/(m^2). Lab Results:   No results found for this or any previous visit (from the past 72 hour(s)).  Physical Findings: AIMS: Facial and Oral Movements  Muscles of Facial Expression: None, normal Lips and Perioral Area: None, normal Jaw: None, normal Tongue: None, normal,Extremity Movements Upper (arms, wrists, hands, fingers): None, normal Lower (legs, knees, ankles, toes): None, normal, Trunk Movements Neck, shoulders, hips: None, normal, Overall Severity Severity of abnormal movements (highest score from questions above): None, normal Incapacitation due to abnormal movements: None, normal Patient's awareness of abnormal movements (rate only patient's report): No Awareness, Dental Status Current problems with teeth and/or dentures?: No Does patient usually wear dentures?: No  CIWA:  CIWA-Ar Total: 0 COWS:  COWS Total Score: 1   See Psychiatric Specialty Exam and Suicide Risk Assessment completed by Attending Physician prior to discharge.  Discharge destination:  Home  Is patient on multiple antipsychotic therapies at discharge:  No   Has Patient had three or more failed trials of antipsychotic monotherapy by history:  No    Recommended Plan for Multiple Antipsychotic  Therapies: NA  Discharge Instructions    Diet - low sodium heart healthy    Complete by:  As directed      Increase activity slowly    Complete by:  As directed             Medication List    TAKE these medications      Indication   carbamazepine 200 MG tablet  Commonly known as:  TEGRETOL  Take 1 tablet (200 mg total) by mouth 2 (two) times daily.   Indication:  Manic-Depression     sertraline 25 MG tablet  Commonly known as:  ZOLOFT  Take 1 tablet (25 mg total) by mouth daily.   Indication:  Major Depressive Disorder     traZODone 50 MG tablet  Commonly known as:  DESYREL  Take 1 tablet (50 mg total) by mouth at bedtime.   Indication:  Trouble Sleeping           Follow-up Information    Follow up with [facility]. Go in 2 days.   Why:  For follow-up care; walk in appointments MWF 8am-3pm; patient followup Wednesday 03/31/15 at 7:00am   Contact information:   [address] [place], [place] Ph [phone] Fax [phone]      Follow-up recommendations:  Activity:  As tolerated. Diet:  Regular. Other:  Keep follow-up appointments.  Comments:    Total Discharge Time: 35 min.  Signed: Liliyana Thobe 03/29/2015, 11:05 AM

## 2015-03-29 NOTE — [facility] Suicide Risk Assessment (Signed)
[facility]   Demographic Factors:  Male and Adolescent or young adult  Total Time spent with patient: 30 minutes  Musculoskeletal: Strength & Muscle Tone: within normal limits Gait & Station: normal Patient leans: N/A  Psychiatric Specialty Exam: Physical Exam  Nursing note and vitals reviewed.   Review of Systems  All other systems reviewed and are negative.   Blood pressure 129/83, pulse 67, temperature 98.4 F (36.9 C), temperature source Oral, resp. rate 20, height 5\' 8"  (1.727 m), weight 77.565 kg (171 lb), SpO2 95 %.Body mass index is 26.01 kg/(m^2).  General Appearance: Casual  Eye Contact::  Good  Speech:  Clear and Coherent409  Volume:  Normal  Mood:  Euthymic  Affect:  Appropriate  Thought Process:  Goal Directed  Orientation:  Full (Time, Place, and Person)  Thought Content:  WDL  Suicidal Thoughts:  No  Homicidal Thoughts:  No  Memory:  Immediate;   Fair Recent;   Fair Remote;   Fair  Judgement:  Fair  Insight:  Fair  Psychomotor Activity:  Normal  Concentration:  Fair  Recall:  FiservFair  Fund of Knowledge:Fair  Language: Fair  Akathisia:  No  Handed:  Right  AIMS (if indicated):     Assets:  Communication Skills Desire for Improvement Housing Physical Health Social Support  Sleep:  Number of Hours: 7  Cognition: WNL  ADL's:  Intact   Have you used any form of tobacco in the last 30 days? (Cigarettes, Smokeless Tobacco, Cigars, and/or Pipes): Yes  Has this patient used any form of tobacco in the last 30 days? (Cigarettes, Smokeless Tobacco, Cigars, and/or Pipes) No  Mental Status Per Nursing Assessment::   On Admission:     Current Mental Status by Physician: NA  Loss Factors: NA  Historical Factors: Impulsivity  Risk Reduction Factors:   Pregnancy, Sense of responsibility to family, Employed and Positive social support  Continued Clinical Symptoms:  Bipolar Disorder:   Depressive phase Alcohol/Substance  Abuse/Dependencies  Cognitive Features That Contribute To Risk:  None    Suicide Risk:  Minimal: No identifiable suicidal ideation.  Patients presenting with no risk factors but with morbid ruminations; may be classified as minimal risk based on the severity of the depressive symptoms  Principal Problem: Bipolar I disorder, most recent episode depressed Discharge Diagnoses:  Patient Active Problem List   Diagnosis Date Noted  . Bipolar I disorder, most recent episode depressed [F31.30] 03/25/2015  . 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 Go in 2 days.   Why:  For follow-up care; walk in appointments MWF 8am-3pm; patient followup Wednesday 03/31/15 at 7:00am   Contact information:   [address] [place], [place] Ph [phone] Fax [phone]      Plan Of Care/Follow-up recommendations:  Activity:  As tolerated. Diet:  Regular. Other:  Keep follow-up appointments.  Is patient on multiple antipsychotic therapies at discharge:  No   Has Patient had three or more failed trials of antipsychotic monotherapy by history:  No  Recommended Plan for Multiple Antipsychotic Therapies: NA    Ryan Mccormick 03/29/2015, 11:01 AM

## 2015-03-29 NOTE — Progress Notes (Signed)
Pt discharged home. DC instructions provided and explained. Medications reviewed. Rx given,all questions answered. Pt stable at discharge. Denies SI, HI, AVH

## 2015-04-13 ENCOUNTER — Ambulatory Visit: Payer: Self-pay

## 2016-03-07 ENCOUNTER — Encounter: Payer: Self-pay | Admitting: Intensive Care

## 2016-03-07 ENCOUNTER — Emergency Department
Admission: EM | Admit: 2016-03-07 | Discharge: 2016-03-07 | Disposition: A | Discharge status: Home / Self Care | Payer: Self-pay | Attending: Emergency Medicine | Admitting: Emergency Medicine

## 2016-03-07 DIAGNOSIS — F1290 Cannabis use, unspecified, uncomplicated: Secondary | ICD-10-CM | POA: Insufficient documentation

## 2016-03-07 DIAGNOSIS — T424X1A Poisoning by benzodiazepines, accidental (unintentional), initial encounter: Secondary | ICD-10-CM | POA: Insufficient documentation

## 2016-03-07 DIAGNOSIS — Z79899 Other long term (current) drug therapy: Secondary | ICD-10-CM | POA: Insufficient documentation

## 2016-03-07 DIAGNOSIS — T50901A Poisoning by unspecified drugs, medicaments and biological substances, accidental (unintentional), initial encounter: Secondary | ICD-10-CM

## 2016-03-07 DIAGNOSIS — F17210 Nicotine dependence, cigarettes, uncomplicated: Secondary | ICD-10-CM | POA: Insufficient documentation

## 2016-03-07 LAB — COMPREHENSIVE METABOLIC PANEL
ALT: 18 U/L (ref 17–63)
ANION GAP: 6 (ref 5–15)
AST: 21 U/L (ref 15–41)
Albumin: 4.3 g/dL (ref 3.5–5.0)
Alkaline Phosphatase: 76 U/L (ref 38–126)
BUN: 20 mg/dL (ref 6–20)
CO2: 25 mmol/L (ref 22–32)
Calcium: 9.5 mg/dL (ref 8.9–10.3)
Chloride: 109 mmol/L (ref 101–111)
Creatinine, Ser: 0.83 mg/dL (ref 0.61–1.24)
GFR calc Af Amer: >60 mL/min (ref >60)
Glucose, Bld: 81 mg/dL (ref 65–99)
POTASSIUM: 3.6 mmol/L (ref 3.5–5.1)
Sodium: 140 mmol/L (ref 135–145)
TOTAL PROTEIN: 7.4 g/dL (ref 6.5–8.1)
Total Bilirubin: 0.8 mg/dL (ref 0.3–1.2)

## 2016-03-07 LAB — CBC WITH DIFFERENTIAL/PLATELET
Basophils Absolute: 0 10*3/uL (ref 0–0.1)
Basophils Relative: 1 %
Eosinophils Absolute: 0.1 10*3/uL (ref 0–0.7)
Eosinophils Relative: 1 %
HCT: 44.4 % (ref 40.0–52.0)
HEMOGLOBIN: 15.5 g/dL (ref 13.0–18.0)
LYMPHS ABS: 1.9 10*3/uL (ref 1.0–3.6)
Lymphocytes Relative: 28 %
MCH: 29.8 pg (ref 26.0–34.0)
MCHC: 34.9 g/dL (ref 32.0–36.0)
MCV: 85.4 fL (ref 80.0–100.0)
MONO ABS: 0.6 10*3/uL (ref 0.2–1.0)
MONOS PCT: 8 %
NEUTROS ABS: 4.2 10*3/uL (ref 1.4–6.5)
Neutrophils Relative %: 62 %
Platelets: 182 10*3/uL (ref 150–440)
RBC: 5.19 MIL/uL (ref 4.40–5.90)
RDW: 12.6 % (ref 11.5–14.5)
WBC: 6.8 10*3/uL (ref 3.8–10.6)

## 2016-03-07 LAB — ACETAMINOPHEN LEVEL: Acetaminophen (Tylenol), Serum: <10 ug/mL — ABNORMAL LOW (ref 10–30)

## 2016-03-07 LAB — SALICYLATE LEVEL: Salicylate Lvl: <4 mg/dL (ref 2.8–30.0)

## 2016-03-07 LAB — ETHANOL

## 2016-03-07 MED ORDER — SODIUM CHLORIDE 0.9 % IV BOLUS (SEPSIS)
1000.0000 mL | Freq: Once | INTRAVENOUS | Status: AC
  Administered 2016-03-07: 1000 mL via INTRAVENOUS
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
## 2016-03-07 NOTE — Discharge Instructions (Signed)
Stop using drugs.   See your doctor   You will be sleepy for several hours.   Return to ER if he is unable to wake up, thoughts of harming yourself or others.
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
## 2017-03-05 ENCOUNTER — Emergency Department
Admission: EM | Admit: 2017-03-05 | Discharge: 2017-03-06 | Disposition: A | Discharge status: Home / Self Care | Payer: No Typology Code available for payment source | Attending: Emergency Medicine | Admitting: Emergency Medicine

## 2017-03-05 ENCOUNTER — Encounter: Payer: Self-pay | Admitting: Emergency Medicine

## 2017-03-05 DIAGNOSIS — S4992XA Unspecified injury of left shoulder and upper arm, initial encounter: Secondary | ICD-10-CM | POA: Diagnosis present

## 2017-03-05 DIAGNOSIS — Y999 Unspecified external cause status: Secondary | ICD-10-CM | POA: Insufficient documentation

## 2017-03-05 DIAGNOSIS — F17210 Nicotine dependence, cigarettes, uncomplicated: Secondary | ICD-10-CM | POA: Diagnosis not present

## 2017-03-05 DIAGNOSIS — S7001XA Contusion of right hip, initial encounter: Secondary | ICD-10-CM | POA: Diagnosis not present

## 2017-03-05 DIAGNOSIS — S0990XA Unspecified injury of head, initial encounter: Secondary | ICD-10-CM | POA: Diagnosis not present

## 2017-03-05 DIAGNOSIS — S70311A Abrasion, right thigh, initial encounter: Secondary | ICD-10-CM | POA: Diagnosis not present

## 2017-03-05 DIAGNOSIS — S41012A Laceration without foreign body of left shoulder, initial encounter: Secondary | ICD-10-CM | POA: Diagnosis not present

## 2017-03-05 DIAGNOSIS — S70211A Abrasion, right hip, initial encounter: Secondary | ICD-10-CM | POA: Diagnosis not present

## 2017-03-05 DIAGNOSIS — M542 Cervicalgia: Secondary | ICD-10-CM | POA: Diagnosis not present

## 2017-03-05 DIAGNOSIS — R1031 Right lower quadrant pain: Secondary | ICD-10-CM | POA: Insufficient documentation

## 2017-03-05 DIAGNOSIS — Y92410 Unspecified street and highway as the place of occurrence of the external cause: Secondary | ICD-10-CM | POA: Diagnosis not present

## 2017-03-05 DIAGNOSIS — Y939 Activity, unspecified: Secondary | ICD-10-CM | POA: Diagnosis not present

## 2017-03-05 DIAGNOSIS — S40811A Abrasion of right upper arm, initial encounter: Secondary | ICD-10-CM | POA: Insufficient documentation

## 2017-03-05 DIAGNOSIS — Z79899 Other long term (current) drug therapy: Secondary | ICD-10-CM | POA: Insufficient documentation

## 2017-03-05 DIAGNOSIS — T148XXA Other injury of unspecified body region, initial encounter: Secondary | ICD-10-CM

## 2017-03-05 MED ORDER — ONDANSETRON HCL 4 MG/2ML IJ SOLN
4.0000 mg | Freq: Once | INTRAMUSCULAR | Status: AC
  Administered 2017-03-06: 4 mg via INTRAVENOUS
  Filled 2017-03-05: qty 2

## 2017-03-05 MED ORDER — MORPHINE SULFATE (PF) 2 MG/ML IV SOLN
2.0000 mg | Freq: Once | INTRAVENOUS | Status: AC
  Administered 2017-03-06: 2 mg via INTRAVENOUS
  Filled 2017-03-05: qty 1
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
## 2017-03-06 ENCOUNTER — Emergency Department: Payer: No Typology Code available for payment source

## 2017-03-06 ENCOUNTER — Encounter: Payer: Self-pay | Admitting: Radiology

## 2017-03-06 LAB — CBC
HEMATOCRIT: 44.6 % (ref 40.0–52.0)
Hemoglobin: 15.9 g/dL (ref 13.0–18.0)
MCH: 31 pg (ref 26.0–34.0)
MCHC: 35.8 g/dL (ref 32.0–36.0)
MCV: 86.6 fL (ref 80.0–100.0)
Platelets: 227 10*3/uL (ref 150–440)
RBC: 5.15 MIL/uL (ref 4.40–5.90)
RDW: 12.8 % (ref 11.5–14.5)
WBC: 10.4 10*3/uL (ref 3.8–10.6)

## 2017-03-06 LAB — COMPREHENSIVE METABOLIC PANEL
ALBUMIN: 4.4 g/dL (ref 3.5–5.0)
ALT: 25 U/L (ref 17–63)
AST: 33 U/L (ref 15–41)
Alkaline Phosphatase: 63 U/L (ref 38–126)
Anion gap: 8 (ref 5–15)
BUN: 12 mg/dL (ref 6–20)
CALCIUM: 9.5 mg/dL (ref 8.9–10.3)
CO2: 27 mmol/L (ref 22–32)
Chloride: 107 mmol/L (ref 101–111)
Creatinine, Ser: 0.83 mg/dL (ref 0.61–1.24)
GFR calc Af Amer: >60 mL/min (ref >60)
GFR calc non Af Amer: >60 mL/min (ref >60)
GLUCOSE: 115 mg/dL — AB (ref 65–99)
Potassium: 3.4 mmol/L — ABNORMAL LOW (ref 3.5–5.1)
Sodium: 142 mmol/L (ref 135–145)
Total Bilirubin: 0.6 mg/dL (ref 0.3–1.2)
Total Protein: 7.8 g/dL (ref 6.5–8.1)

## 2017-03-06 MED ORDER — IOPAMIDOL (ISOVUE-300) INJECTION 61%
100.0000 mL | Freq: Once | INTRAVENOUS | Status: AC | PRN
  Administered 2017-03-06: 100 mL via INTRAVENOUS

## 2017-03-06 MED ORDER — DIAZEPAM 5 MG PO TABS
ORAL_TABLET | ORAL | Status: AC
  Filled 2017-03-06: qty 1

## 2017-03-06 MED ORDER — OXYCODONE-ACETAMINOPHEN 5-325 MG PO TABS: 1.0000 | ORAL_TABLET | Freq: Four times a day (QID) | ORAL | 0 refills | Status: DC | PRN
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
## 2017-03-06 NOTE — ED Notes (Signed)
Patient transported to CT and xray 

## 2017-04-26 ENCOUNTER — Emergency Department
Admission: EM | Admit: 2017-04-26 | Discharge: 2017-04-26 | Disposition: A | Discharge status: Home / Self Care | Payer: Self-pay | Attending: Emergency Medicine | Admitting: Emergency Medicine

## 2017-04-26 ENCOUNTER — Emergency Department: Payer: Self-pay

## 2017-04-26 ENCOUNTER — Encounter: Payer: Self-pay | Admitting: Emergency Medicine

## 2017-04-26 DIAGNOSIS — Z79899 Other long term (current) drug therapy: Secondary | ICD-10-CM | POA: Insufficient documentation

## 2017-04-26 DIAGNOSIS — K8020 Calculus of gallbladder without cholecystitis without obstruction: Secondary | ICD-10-CM | POA: Insufficient documentation

## 2017-04-26 DIAGNOSIS — F319 Bipolar disorder, unspecified: Secondary | ICD-10-CM | POA: Insufficient documentation

## 2017-04-26 DIAGNOSIS — R1013 Epigastric pain: Secondary | ICD-10-CM | POA: Insufficient documentation

## 2017-04-26 DIAGNOSIS — F17210 Nicotine dependence, cigarettes, uncomplicated: Secondary | ICD-10-CM | POA: Insufficient documentation

## 2017-04-26 DIAGNOSIS — F1910 Other psychoactive substance abuse, uncomplicated: Secondary | ICD-10-CM | POA: Insufficient documentation

## 2017-04-26 LAB — CBC WITH DIFFERENTIAL/PLATELET
BASOS ABS: 0 10*3/uL (ref 0–0.1)
BASOS PCT: 1 %
EOS ABS: 0 10*3/uL (ref 0–0.7)
EOS PCT: 0 %
HCT: 50.6 % (ref 40.0–52.0)
HEMOGLOBIN: 17.5 g/dL (ref 13.0–18.0)
LYMPHS ABS: 3.9 10*3/uL — AB (ref 1.0–3.6)
Lymphocytes Relative: 44 %
MCH: 30 pg (ref 26.0–34.0)
MCHC: 34.5 g/dL (ref 32.0–36.0)
MCV: 87 fL (ref 80.0–100.0)
Monocytes Absolute: 1 10*3/uL (ref 0.2–1.0)
Monocytes Relative: 11 %
NEUTROS PCT: 44 %
Neutro Abs: 4 10*3/uL (ref 1.4–6.5)
PLATELETS: 275 10*3/uL (ref 150–440)
RBC: 5.81 MIL/uL (ref 4.40–5.90)
RDW: 12.6 % (ref 11.5–14.5)
WBC: 9 10*3/uL (ref 3.8–10.6)

## 2017-04-26 LAB — URINE DRUG SCREEN, QUALITATIVE (ARMC ONLY)
Amphetamines, Ur Screen: POSITIVE — AB
BENZODIAZEPINE, UR SCRN: NOT DETECTED
Barbiturates, Ur Screen: NOT DETECTED
CANNABINOID 50 NG, UR ~~LOC~~: POSITIVE — AB
Cocaine Metabolite,Ur ~~LOC~~: POSITIVE — AB
MDMA (ECSTASY) UR SCREEN: NOT DETECTED
Methadone Scn, Ur: NOT DETECTED
Opiate, Ur Screen: NOT DETECTED
PHENCYCLIDINE (PCP) UR S: NOT DETECTED
TRICYCLIC, UR SCREEN: NOT DETECTED

## 2017-04-26 LAB — COMPREHENSIVE METABOLIC PANEL
ALBUMIN: 4.7 g/dL (ref 3.5–5.0)
ALK PHOS: 73 U/L (ref 38–126)
ALT: 28 U/L (ref 17–63)
AST: 41 U/L (ref 15–41)
Anion gap: 12 (ref 5–15)
BUN: 14 mg/dL (ref 6–20)
CALCIUM: 9.7 mg/dL (ref 8.9–10.3)
CHLORIDE: 104 mmol/L (ref 101–111)
CO2: 24 mmol/L (ref 22–32)
CREATININE: 1.36 mg/dL — AB (ref 0.61–1.24)
GFR calc Af Amer: >60 mL/min (ref >60)
GFR calc non Af Amer: >60 mL/min (ref >60)
GLUCOSE: 152 mg/dL — AB (ref 65–99)
Potassium: 3.1 mmol/L — ABNORMAL LOW (ref 3.5–5.1)
SODIUM: 140 mmol/L (ref 135–145)
Total Bilirubin: 1.2 mg/dL (ref 0.3–1.2)
Total Protein: 8 g/dL (ref 6.5–8.1)

## 2017-04-26 LAB — URINALYSIS, COMPLETE (UACMP) WITH MICROSCOPIC
BACTERIA UA: NONE SEEN
GLUCOSE, UA: NEGATIVE mg/dL
HGB URINE DIPSTICK: NEGATIVE
KETONES UR: 5 mg/dL — AB
Leukocytes, UA: NEGATIVE
NITRITE: NEGATIVE
PROTEIN: 100 mg/dL — AB
Specific Gravity, Urine: 1.031 — ABNORMAL HIGH (ref 1.005–1.030)
Squamous Epithelial / LPF: NONE SEEN
pH: 5 (ref 5.0–8.0)

## 2017-04-26 LAB — LIPASE, BLOOD: Lipase: 111 U/L — ABNORMAL HIGH (ref 11–51)

## 2017-04-26 MED ORDER — GI COCKTAIL ~~LOC~~
30.0000 mL | Freq: Once | ORAL | Status: AC
  Administered 2017-04-26: 30 mL via ORAL

## 2017-04-26 MED ORDER — ONDANSETRON HCL 4 MG/2ML IJ SOLN
4.0000 mg | Freq: Once | INTRAMUSCULAR | Status: AC
  Administered 2017-04-26: 4 mg via INTRAVENOUS

## 2017-04-26 MED ORDER — ONDANSETRON 4 MG PO TBDP: 4.0000 mg | ORAL_TABLET | Freq: Three times a day (TID) | ORAL | 0 refills | Status: AC | PRN

## 2017-04-26 MED ORDER — GI COCKTAIL ~~LOC~~
ORAL | Status: AC
  Filled 2017-04-26: qty 30

## 2017-04-26 MED ORDER — DICYCLOMINE HCL 10 MG/ML IM SOLN
20.0000 mg | Freq: Once | INTRAMUSCULAR | Status: AC
  Administered 2017-04-26: 20 mg via INTRAMUSCULAR
  Filled 2017-04-26: qty 2

## 2017-04-26 MED ORDER — ONDANSETRON HCL 4 MG/2ML IJ SOLN
INTRAMUSCULAR | Status: AC
  Filled 2017-04-26: qty 2

## 2017-04-26 NOTE — ED Provider Notes (Signed)
Pain resolved. Tolerating oral intake. Asymptomatic. Ultrasound shows tiny gallstones, mobile without obstruction or evidence of choledocholithiasis. Vital signs normal. Patient advised to avoid alcohol and to stop using drugs, referred [facility] and primary care.   Sharman CheekStafford, Reyah Streeter, MD 04/26/17 [phone]

## 2017-04-26 NOTE — ED Provider Notes (Signed)
[facility] Emergency Department Provider Note   ____________________________________________   I have reviewed the triage vital signs and the nursing notes.   HISTORY  Chief Complaint Abdominal Pain   History limited by: Not Limited   HPI Ryan Mccormick is a 21 y.o. male who presents to the emergency department today because of abdominal pain. He states it is located in the epigastric region. It started roughly 45 minutes ago. It is severe. It does not radiate. It has been accompanied by vomiting. He denies any unusual ingestion. Denies any alcohol or NSAID use. He is not aware of any family history for abdominal complaints.     Past Medical History:  Diagnosis Date  . Medical history non-contributory     Patient Active Problem List   Diagnosis Date Noted  . Bipolar I disorder, most recent episode depressed (HCC) 03/25/2015  . Sedative, hypnotic, or anxiolytic dependence with physiological dependence 03/25/2015    No past surgical history on file.  Prior to Admission medications   Medication Sig Start Date End Date Taking? Authorizing Provider  carbamazepine (TEGRETOL) 200 MG tablet Take 1 tablet (200 mg total) by mouth 2 (two) times daily. 03/29/15   Pucilowska, Braulio Conte B, MD  oxyCODONE-acetaminophen (ROXICET) 5-325 MG tablet Take 1 tablet by mouth every 6 (six) hours as needed for severe pain. 03/06/17   Darci Current, MD  sertraline (ZOLOFT) 25 MG tablet Take 1 tablet (25 mg total) by mouth daily. 03/29/15   Pucilowska, Braulio Conte B, MD  traZODone (DESYREL) 50 MG tablet Take 1 tablet (50 mg total) by mouth at bedtime. 03/29/15   Pucilowska, Ellin Goodie, MD    Allergies Patient has no known allergies.  No family history on file.  Social History Social History  Substance Use Topics  . Smoking status: Current Every Day Smoker    Types: Cigarettes  . Smokeless tobacco: Never Used  . Alcohol use Yes     Comment: pt states occ    Review of  Systems Constitutional: No fever/chills Eyes: No visual changes. ENT: No sore throat. Cardiovascular: Denies chest pain. Respiratory: Denies shortness of breath. Gastrointestinal: Positive for abdominal pain. Genitourinary: Negative for dysuria. Musculoskeletal: Negative for back pain. Skin: Negative for rash. Neurological: Negative for headaches, focal weakness or numbness.  ____________________________________________   PHYSICAL EXAM:  VITAL SIGNS: ED Triage Vitals [04/26/17 1338]  Enc Vitals Group     BP 120/69     Pulse 65     Resp 24     Temp 99.1     Temp src      SpO2 100     Weight 150 lb (68 kg)     Height 5\' 5"  (1.651 m)     Head Circumference      Peak Flow      Pain Score 10    Constitutional: Alert and oriented. Appears uncomfortable Eyes: Conjunctivae are normal.  ENT   Head: Normocephalic and atraumatic.   Nose: No congestion/rhinnorhea.   Mouth/Throat: Mucous membranes are moist.   Neck: No stridor. Hematological/Lymphatic/Immunilogical: No cervical lymphadenopathy. Cardiovascular: Normal rate, regular rhythm.  No murmurs, rubs, or gallops. Respiratory: Normal respiratory effort without tachypnea nor retractions. Breath sounds are clear and equal bilaterally. No wheezes/rales/rhonchi. Gastrointestinal: Soft and somewhat tender in the upper abdomen.  Genitourinary: Deferred Musculoskeletal: Normal range of motion in all extremities. No lower extremity edema. Neurologic:  Normal speech and language. No gross focal neurologic deficits are appreciated.  Skin:  Skin is warm, dry  and intact. No rash noted. Psychiatric: Mood and affect are normal. Speech and behavior are normal. Patient exhibits appropriate insight and judgment.  ____________________________________________    LABS (pertinent positives/negatives)  Labs Reviewed  CBC WITH DIFFERENTIAL/PLATELET - Abnormal; Notable for the following:       Result Value   Lymphs Abs 3.9 (*)     All other components within normal limits  COMPREHENSIVE METABOLIC PANEL - Abnormal; Notable for the following:    Potassium 3.1 (*)    Glucose, Bld 152 (*)    Creatinine, Ser 1.36 (*)    All other components within normal limits  LIPASE, BLOOD - Abnormal; Notable for the following:    Lipase 111 (*)    All other components within normal limits  URINALYSIS, COMPLETE (UACMP) WITH MICROSCOPIC  URINE DRUG SCREEN, QUALITATIVE ([facility] ONLY)     ____________________________________________   EKG  None  ____________________________________________    RADIOLOGY  US pending  ____________________________________________   PROCEDURES  Procedures  ____________________________________________   INITIAL IMPRESSION / ASSESSMENT AND PLAN / ED COURSE  Pertinent labs & imaging results that were available during my care of the patient were reviewed by me and considered in my medical decision making (see chart for details).  Patient presented to the emergency department today because of concerns for epigastric pain. The pain did improve after GI cocktail. The patient's lipase was elevated. He is unaware of any gallstone disease in his family however will check a right upper quadrant ultrasound.  ____________________________________________   FINAL CLINICAL IMPRESSION(S) / ED DIAGNOSES  Final diagnoses:  Epigastric abdominal pain     Note: This dictation was prepared with Dragon dictation. Any transcriptional errors that result from this process are unintentional     Phineas SemenGoodman, Belisa Eichholz, MD 04/26/17 1515

## 2017-04-26 NOTE — ED Triage Notes (Signed)
Pt arrived via POV reports severe abdominal pain and vomiting that began today at approximately 1 pm today. Pt moaning and pacing in triage.

## 2017-04-26 NOTE — Discharge Instructions (Signed)
Avoid alcohol and illicit drugs as these are damaging your gallbladder and pancreas and will cause permanent health problems.  Follow up with [facility] for help quitting drugs.
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
